# Patient Record
Sex: Female | Born: 1970 | Race: White | Hispanic: No | Marital: Single | Smoking: Current every day smoker
Health system: Southern US, Community
[De-identification: ages and names within clinical notes are randomized; demographics above are authoritative.]

## PROBLEM LIST (undated history)

## (undated) DIAGNOSIS — R609 Edema, unspecified: Secondary | ICD-10-CM

## (undated) DIAGNOSIS — K219 Gastro-esophageal reflux disease without esophagitis: Secondary | ICD-10-CM

## (undated) DIAGNOSIS — E119 Type 2 diabetes mellitus without complications: Secondary | ICD-10-CM

## (undated) DIAGNOSIS — I10 Essential (primary) hypertension: Secondary | ICD-10-CM

## (undated) DIAGNOSIS — I509 Heart failure, unspecified: Secondary | ICD-10-CM

## (undated) HISTORY — PX: BILIOPANCREATIC DIVISION W DUODENAL SWITCH: SHX1098

## (undated) HISTORY — PX: TONSILLECTOMY: SUR1361

## (undated) HISTORY — DX: Heart failure, unspecified: I50.9

## (undated) HISTORY — DX: Edema, unspecified: R60.9

## (undated) HISTORY — PX: CHOLECYSTECTOMY: SHX55

## (undated) HISTORY — PX: DILATION AND CURETTAGE OF UTERUS: SHX78

---

## 1999-01-27 ENCOUNTER — Ambulatory Visit (HOSPITAL_COMMUNITY): Admission: RE | Admit: 1999-01-27 | Discharge: 1999-01-27 | Payer: Self-pay | Admitting: *Deleted

## 1999-05-01 ENCOUNTER — Ambulatory Visit (HOSPITAL_COMMUNITY): Admission: RE | Admit: 1999-05-01 | Discharge: 1999-05-03 | Payer: Self-pay | Admitting: Otolaryngology

## 2000-03-21 ENCOUNTER — Other Ambulatory Visit: Admission: RE | Admit: 2000-03-21 | Discharge: 2000-03-21 | Payer: Self-pay | Admitting: Obstetrics and Gynecology

## 2000-06-01 ENCOUNTER — Emergency Department (HOSPITAL_COMMUNITY): Admission: EM | Admit: 2000-06-01 | Discharge: 2000-06-01 | Payer: Self-pay | Admitting: Emergency Medicine

## 2000-06-01 ENCOUNTER — Encounter: Payer: Self-pay | Admitting: Emergency Medicine

## 2001-07-14 ENCOUNTER — Encounter: Admission: RE | Admit: 2001-07-14 | Discharge: 2001-07-14 | Payer: Self-pay | Admitting: Internal Medicine

## 2002-02-07 ENCOUNTER — Ambulatory Visit (HOSPITAL_BASED_OUTPATIENT_CLINIC_OR_DEPARTMENT_OTHER): Admission: RE | Admit: 2002-02-07 | Discharge: 2002-02-07 | Payer: Self-pay | Admitting: Critical Care Medicine

## 2003-01-16 ENCOUNTER — Emergency Department (HOSPITAL_COMMUNITY): Admission: EM | Admit: 2003-01-16 | Discharge: 2003-01-16 | Payer: Self-pay | Admitting: Emergency Medicine

## 2003-06-18 ENCOUNTER — Other Ambulatory Visit: Admission: RE | Admit: 2003-06-18 | Discharge: 2003-06-18 | Payer: Self-pay | Admitting: Obstetrics and Gynecology

## 2003-07-12 ENCOUNTER — Ambulatory Visit (HOSPITAL_BASED_OUTPATIENT_CLINIC_OR_DEPARTMENT_OTHER): Admission: RE | Admit: 2003-07-12 | Discharge: 2003-07-12 | Payer: Self-pay | Admitting: Critical Care Medicine

## 2003-08-11 ENCOUNTER — Emergency Department (HOSPITAL_COMMUNITY): Admission: EM | Admit: 2003-08-11 | Discharge: 2003-08-12 | Payer: Self-pay | Admitting: *Deleted

## 2004-04-13 ENCOUNTER — Ambulatory Visit: Payer: Self-pay | Admitting: Family Medicine

## 2006-02-24 ENCOUNTER — Encounter (INDEPENDENT_AMBULATORY_CARE_PROVIDER_SITE_OTHER): Payer: Self-pay | Admitting: Specialist

## 2006-02-24 ENCOUNTER — Ambulatory Visit (HOSPITAL_COMMUNITY): Admission: RE | Admit: 2006-02-24 | Discharge: 2006-02-24 | Payer: Self-pay | Admitting: Obstetrics and Gynecology

## 2007-05-08 ENCOUNTER — Ambulatory Visit (HOSPITAL_COMMUNITY): Admission: RE | Admit: 2007-05-08 | Discharge: 2007-05-08 | Payer: Self-pay | Admitting: Obstetrics and Gynecology

## 2007-05-15 ENCOUNTER — Encounter: Admission: RE | Admit: 2007-05-15 | Discharge: 2007-05-15 | Payer: Self-pay | Admitting: Family Medicine

## 2008-09-23 ENCOUNTER — Encounter: Admission: RE | Admit: 2008-09-23 | Discharge: 2008-09-23 | Payer: Self-pay | Admitting: Family Medicine

## 2008-10-30 ENCOUNTER — Encounter: Admission: RE | Admit: 2008-10-30 | Discharge: 2008-10-30 | Payer: Self-pay | Admitting: Family Medicine

## 2009-01-02 DIAGNOSIS — K219 Gastro-esophageal reflux disease without esophagitis: Secondary | ICD-10-CM | POA: Diagnosis present

## 2009-03-13 ENCOUNTER — Inpatient Hospital Stay (HOSPITAL_COMMUNITY): Admission: AD | Admit: 2009-03-13 | Discharge: 2009-03-13 | Payer: Self-pay | Admitting: Obstetrics and Gynecology

## 2009-03-14 ENCOUNTER — Inpatient Hospital Stay (HOSPITAL_COMMUNITY): Admission: AD | Admit: 2009-03-14 | Discharge: 2009-03-14 | Payer: Self-pay | Admitting: Obstetrics and Gynecology

## 2009-03-17 ENCOUNTER — Inpatient Hospital Stay (HOSPITAL_COMMUNITY): Admission: AD | Admit: 2009-03-17 | Discharge: 2009-03-17 | Payer: Self-pay | Admitting: Obstetrics and Gynecology

## 2009-03-20 ENCOUNTER — Inpatient Hospital Stay (HOSPITAL_COMMUNITY): Admission: AD | Admit: 2009-03-20 | Discharge: 2009-03-20 | Payer: Self-pay | Admitting: Obstetrics and Gynecology

## 2009-03-23 ENCOUNTER — Inpatient Hospital Stay (HOSPITAL_COMMUNITY): Admission: AD | Admit: 2009-03-23 | Discharge: 2009-03-23 | Payer: Self-pay | Admitting: Obstetrics and Gynecology

## 2009-03-27 ENCOUNTER — Inpatient Hospital Stay (HOSPITAL_COMMUNITY): Admission: AD | Admit: 2009-03-27 | Discharge: 2009-03-27 | Payer: Self-pay | Admitting: Obstetrics and Gynecology

## 2009-10-27 DIAGNOSIS — E785 Hyperlipidemia, unspecified: Secondary | ICD-10-CM | POA: Diagnosis present

## 2009-10-27 DIAGNOSIS — F4321 Adjustment disorder with depressed mood: Secondary | ICD-10-CM

## 2009-10-27 HISTORY — DX: Adjustment disorder with depressed mood: F43.21

## 2010-05-25 ENCOUNTER — Encounter: Payer: Self-pay | Admitting: Family Medicine

## 2010-08-05 LAB — POCT PREGNANCY, URINE: Preg Test, Ur: POSITIVE

## 2010-08-05 LAB — HCG, QUANTITATIVE, PREGNANCY
hCG, Beta Chain, Quant, S: 7250 m[IU]/mL — ABNORMAL HIGH (ref <5)
hCG, Beta Chain, Quant, S: 7845 m[IU]/mL — ABNORMAL HIGH (ref <5)
hCG, Beta Chain, Quant, S: 6605 m[IU]/mL — ABNORMAL HIGH (ref <5)
hCG, Beta Chain, Quant, S: 8665 m[IU]/mL — ABNORMAL HIGH (ref <5)

## 2010-08-05 LAB — DIFFERENTIAL
Basophils Absolute: 0.1 10*3/uL (ref 0.0–0.1)
Basophils Relative: 1 % (ref 0–1)
Eosinophils Absolute: 0.1 10*3/uL (ref 0.0–0.7)
Eosinophils Absolute: 0.1 10*3/uL (ref 0.0–0.7)
Eosinophils Relative: 2 % (ref 0–5)
Eosinophils Relative: 2 % (ref 0–5)
Lymphocytes Relative: 35 % (ref 12–46)
Lymphs Abs: 2 10*3/uL (ref 0.7–4.0)
Monocytes Relative: 9 % (ref 3–12)
Neutrophils Relative %: 53 % (ref 43–77)
Neutrophils Relative %: 51 % (ref 43–77)

## 2010-08-05 LAB — CREATININE, SERUM
Creatinine, Ser: 0.58 mg/dL (ref 0.4–1.2)
GFR calc Af Amer: >60 mL/min (ref >60)
GFR calc non Af Amer: >60 mL/min (ref >60)
GFR calc non Af Amer: >60 mL/min (ref >60)

## 2010-08-05 LAB — CBC
HCT: 36 % (ref 36.0–46.0)
HCT: 36.8 % (ref 36.0–46.0)
Hemoglobin: 12.2 g/dL (ref 12.0–15.0)
MCV: 91.8 fL (ref 78.0–100.0)
MCV: 92.5 fL (ref 78.0–100.0)
Platelets: 174 10*3/uL (ref 150–400)
Platelets: 196 10*3/uL (ref 150–400)
Platelets: 203 10*3/uL (ref 150–400)
RBC: 4.02 MIL/uL (ref 3.87–5.11)
RDW: 13.7 % (ref 11.5–15.5)
RDW: 13.5 % (ref 11.5–15.5)
WBC: 6.3 10*3/uL (ref 4.0–10.5)
WBC: 6.5 10*3/uL (ref 4.0–10.5)

## 2010-08-05 LAB — AST: AST: 48 U/L — ABNORMAL HIGH (ref 0–37)

## 2010-08-05 LAB — BUN: BUN: 8 mg/dL (ref 6–23)

## 2010-08-31 DIAGNOSIS — I10 Essential (primary) hypertension: Secondary | ICD-10-CM | POA: Diagnosis present

## 2010-09-02 ENCOUNTER — Other Ambulatory Visit: Payer: Self-pay | Admitting: Family Medicine

## 2010-09-02 DIAGNOSIS — Z1231 Encounter for screening mammogram for malignant neoplasm of breast: Secondary | ICD-10-CM

## 2010-09-18 NOTE — Op Note (Signed)
[name], [name]                 ACCOUNT NO.:  [id number]   MEDICAL RECORD NO.:  [id number]          PATIENT TYPE:  AMB   LOCATION:  SDC                           FACILITY:  [facility]   PHYSICIAN:  Malva Limes, M.D.    DATE OF BIRTH:  1970-05-25   DATE OF PROCEDURE:  02/24/2006  DATE OF DISCHARGE:                                 OPERATIVE REPORT   PREOPERATIVE DIAGNOSES:  1. Abnormal uterine bleeding.  2. Possible endometrial polyp.   POSTOPERATIVE DIAGNOSES:  1. Abnormal uterine bleeding.  2. Possible endometrial polyp.   PROCEDURE:  Dilation and curettage.   SURGEON:  Malva Limes, M.D.   ANESTHESIA:  MAC.   DRAINS:  None.   ANTIBIOTICS:  Ancef 1 gram.   SPECIMENS:  Endometrial curettings sent to pathology.   COMPLICATIONS:  None.   PROCEDURE:  The patient was taken to the operating room where a MAC  anesthesia was administered without complications.  She was then placed in  dorsal lithotomy position.  She was prepped with Betadine and draped in the  usual fashion for this procedure.  A sterile speculum was placed in the  vagina.  A single-tooth tenaculum was applied to the anterior cervical lip.  The uterus was sounded to 9 cm.  The cervix was then serially dilated to a  31-French, sharp curettage was then performed and moderate amount of tissue  was removed.  This was sent to pathology.  This concluded the procedure.  The patient was taken to recovery room in stable condition.  Instrument and  lap count correct times one.           ______________________________  Malva Limes, M.D.     MA/MEDQ  D:  02/24/2006  T:  02/25/2006  Job:  [id number]

## 2010-09-18 NOTE — Op Note (Signed)
[name]  Patient:    Lynn Andrade [facility]                       MRN: [id number] Proc. Date: 05/01/99 Adm. Date:  [id number] Attending:  Fernande Boyden CC:         Gloris Manchester. Lazarus Salines, M.D.             Colon Flattery, D.O.             [facility] Sleep lab             Patients chart                           Operative Report  PREOPERATIVE DIAGNOSES: 1. Obstructive sleep apnea. 2. Nasal septal deviation with obstruction. 3. Hypertrophic inferior turbinates, bilateral. 4. Hypertrophic tonsils.  POSTOPERATIVE DIAGNOSES: 1. Obstructive sleep apnea. 2. Nasal septal deviation with obstruction. 3. Hypertrophic inferior turbinates, bilateral. 4. Hypertrophic tonsils.  OPERATIONS: 1. Nasal septoplasty. 2. Modified UPPP. 3. Tonsillectomy/adenoidectomy. 4. Bilateral SMR inferior turbinates.  SURGEON:  Gloris Manchester. Lazarus Salines, M.D.  ANESTHESIA:  General orotracheal.  ESTIMATED BLOOD LOSS:  Minimal.  COMPLICATIONS:  None.  FINDINGS:  Overall narrow nose with a moderate leftward septal deviation and large inferior turbinates bilaterally.  There are 3+ tonsils.  Moderate residual adenoid pad up at the choana.  A long, thin soft palate and central uvula.  DESCRIPTION OF PROCEDURE:  With the patient in the comfortable supine position,  general orotracheal anesthesia was induced without difficulty.  At an appropriate level, the patient was placed in a slight beach chair position and the table turned 90 degrees.  A saline moistened throat pack was placed.  Nasal Fabrizzi were trimmed.  Cocaine crystals 200 mg total were applied on cotton curets to the anterior ethmoid and sphenopalatine ganglion regions on both sides.  Cocaine solution 160 mg total was applied on 1/2 by 3 inch cottonoids to both sides of he septal mucosa.  Then 1% Xylocaine with 1:100,000 epinephrine, 12 cc total, was infiltrated into the floor of the nose on both sides,  into the nasal spine region, into the anterior  membranous columella and into the submucoperichondrial plate of the nasal septum on both sides.  Approximately 10 minutes were allowed for hemostasis and anesthesia to take effect.  Each sterile preparation and draping of the midface was accomplished.  The materials were removed from the nose and throat and were to be intact an correct in number.  The findings were as described above.  A left-sided hemitransfixion incision was sharply executed and carried down to he caudal edge of the quadrangular cartilage and continued to drape the floor of the incision.  The floor tunnel was elevated carried on to the vomer posteriorly and brought forth to the maxillary crest.  A submucoperichondrial tunnel was created on the left side of the septum, carried back to the perpendicular plate of the ethmoid and carried down to the floor of the nose and communicated with the floor tunnel. This flap was elevated intact.  The chondral ethmoid junction was identified and lysed with the caudal elevator. The opposite submucoperiosteal plane was lysed.  The superior perpendicular plate was lysed with an open Jansen-Middleton forceps.  The bone here was very thick.  The inferior portion was dissected free, rocked loose, and delivered with a closed Jansen-Middleton forceps.  Additional bony strictures along the vomer of the chondral  ethmoid region were carefully removed.  At this point, the quadrangular cartilage was disarticulated from the maxillary  crest.  The inferior edge of the quadrangular cartilage which was spurred was resected approximately 2 mm in vertical height and submucosally resected.  The posterior maxillary crest was removed with an open Jansen-Middleton forceps. Slight winging of the maxillary crest was removed with the caudal elevator on each side.  At this point, the septum was straight and was able to be brought in to  the midline with good support of the external nose and secured to the nasal spine with a figure-of-eight 4-0 PDS suture.  Hemostasis was observed.  The septal tunnels were carefully suctioned free.  The flaps were laid down.  The wounds were closed with interrupted 4-0 chromic sutures.  Just prior to completing the septoplasty, the inferior turbinates were infiltrated with a total of 5 cc of 1% Xylocaine with 1:100,000 epinephrine.  After completing the septoplasty, beginning on the right side, the anterior hood of the inferior  turbinate was lysed just inside the nasal valve region.  The medial mucosa and he turbinate was incised in an anterior upsloping fashion.  The turbinate was infractured.  Using angled turbinates scissors, turbinate bone and lateral mucosa was removed in a posterior downsloping fashion taking virtually all the anterior pole and leaving virtually all the posterior pole.  Bony spicules were removed o allow more prompt healing.  The posterior pole was suctioned and coagulated for  hemostasis.  The flap was laid back down and the turbinate was outfractured and  this side was completed.  The left side was done in an identical fashion.  Double-thickness Neosporin impregnated tonsil packs were prepared.  Reinforced 0.030 Silastic splints were prepared and placed in the nasal septal region and secured there with a 3-0 nylon stitch for support up the septum.  Double thickness Telfa plaques were placed along the inferior turbinate on each side.  A 6.5 mm nasal stump which had been shortened to reach just into the nasopharynx were placed on each side between the Silastic and the Telfa to allow postoperative airway. his completed the nasal operation.  Hemostasis was observed.  At this point, the patient was placed in Trendelenburg.  The pharynx was suctioned free.  The throat pack was removed.  Taking care to protect the lips, teeth, and the endotracheal  tube, the Crowe-Davis mouth gag was introduced, expanded for  visualization, and suspended from the Mayo stand in the standard fashion. Findings were as described above.  Palate retractor and mirror were used to visualize the nasopharynx with modest adenoid pad as described above.  Then 0.5% Xylocaine with 1:200,000 epinephrine, 8 cc total, was infiltrated into the peritonsillar plane and on each side for intraoperative hemostasis.  The palate retractor was used to elevate the soft palate so the nasopharynx could be indirectly visualized.  Suction cautery was used to make some small lateral and tags and the moderate adenoid plump at the choana.  This was done with success nd minimal bleeding.  No further attention to the adenoids was felt to be required.  Beginning on the left side, the tonsil was grasped and retracted medially.  The  mucosa overlying the anterior and inferior poles were coagulated and then cut down to the capsule of the tonsil.  Using the cautery tip as a blunt dissector, coagulating crossing vessel was identified, lysing fibrous bands when necessary, the tonsil was dissected free of its muscular fossa from superiorly downward. he tonsil was  removed in its entirety as determined by examination of both tonsil nd fossa.  This small additional quadrant of cautery rendered the fossa hemostatic.  After completing the left side, the right side was done in an identical fashion.  After completing both tonsillectomy and observing for hemostasis, the palatoplasty was planned.  The tattoo mark placed in the office was identified.  A modified palatoplasty with preservation of the posterior aspect of the uvula was planned. Therefore, a roughly diamond-shaped incision was made on the anterior surface of the soft palate with a inverted Chevron for the uvula and resecting a portion of the posterior tonsillar pillar superiorly and a portion of the anterior  soft palatal mucosa in one piece.  Hemostasis was spontaneous.  The posterior tonsillar pillar was incised in a 45 degrees angle fashion allowing part to approximate into the tonsillar fossa and part to approximate up on to the soft palate.  The posterior remnant of the uvula was brought forth into the [place] and secured there too with a 4-0 Vicryl stitch.  Additional Vicryl stitches were used to bring the posterior mucosa of the soft palate forward to allow the closure line to be on the anterior surface of the soft palate and not of the free edge.  The remainder of the posterior tonsillar pillar was secured to the anterior tonsillar pillar under mild tension.  Small horizontal banding of the posterior pharyngeal wall demonstrated adequate tension on the suture line.  Hemostasis was observed. Mirror examination revealed hemostasis in the nasopharynx and also good configuration f the soft palate.  At this point, the mouth gag was relaxed for several minutes.  Upon re-expansion, hemostasis was persistent.  At this point, the procedure was completed.  The palate, retractor, and mouth gag were relaxed and removed.  The dental status was intact.  The patient was returned to anesthesia, awakened, extubated, and transferred to recovery room in stable condition.  COMMENTS:  A 40 year old white female with a long history of snoring and progressive history of sleep apnea documented with nocturnal pause sonography was the indication for the several portions of todays procedure.  Anticipated routine postoperative recovery with attention to analgesia, antibiotics, hydration, and  observation for bleeding, emesis, or airway compromise/hypoventilation/hypoxia.DD:  05/01/99 TD:  05/02/99 Job: [id number]

## 2010-10-12 ENCOUNTER — Ambulatory Visit: Payer: Self-pay

## 2010-10-19 ENCOUNTER — Ambulatory Visit: Payer: Self-pay

## 2012-10-04 ENCOUNTER — Other Ambulatory Visit: Payer: Self-pay | Admitting: Obstetrics and Gynecology

## 2013-08-07 ENCOUNTER — Emergency Department (HOSPITAL_COMMUNITY)
Admission: EM | Admit: 2013-08-07 | Discharge: 2013-08-07 | Disposition: A | Discharge status: Home / Self Care | Payer: BC Managed Care – PPO | Attending: Emergency Medicine | Admitting: Emergency Medicine

## 2013-08-07 ENCOUNTER — Encounter (HOSPITAL_COMMUNITY): Payer: Self-pay | Admitting: Emergency Medicine

## 2013-08-07 DIAGNOSIS — M436 Torticollis: Secondary | ICD-10-CM | POA: Insufficient documentation

## 2013-08-07 DIAGNOSIS — I10 Essential (primary) hypertension: Secondary | ICD-10-CM | POA: Insufficient documentation

## 2013-08-07 DIAGNOSIS — E119 Type 2 diabetes mellitus without complications: Secondary | ICD-10-CM | POA: Insufficient documentation

## 2013-08-07 DIAGNOSIS — R6883 Chills (without fever): Secondary | ICD-10-CM | POA: Insufficient documentation

## 2013-08-07 DIAGNOSIS — F17200 Nicotine dependence, unspecified, uncomplicated: Secondary | ICD-10-CM | POA: Insufficient documentation

## 2013-08-07 DIAGNOSIS — Z8719 Personal history of other diseases of the digestive system: Secondary | ICD-10-CM | POA: Insufficient documentation

## 2013-08-07 HISTORY — DX: Type 2 diabetes mellitus without complications: E11.9

## 2013-08-07 HISTORY — DX: Gastro-esophageal reflux disease without esophagitis: K21.9

## 2013-08-07 HISTORY — DX: Edema, unspecified: R60.9

## 2013-08-07 HISTORY — DX: Essential (primary) hypertension: I10

## 2013-08-07 MED ORDER — CYCLOBENZAPRINE HCL 10 MG PO TABS: 10.0000 mg | ORAL_TABLET | Freq: Two times a day (BID) | ORAL | Status: DC | PRN

## 2013-08-07 MED ORDER — CYCLOBENZAPRINE HCL 10 MG PO TABS
10.0000 mg | ORAL_TABLET | Freq: Once | ORAL | Status: AC
  Administered 2013-08-07: 10 mg via ORAL
  Filled 2013-08-07: qty 1

## 2013-08-07 MED ORDER — HYDROCODONE-ACETAMINOPHEN 5-325 MG PO TABS: 1.0000 | ORAL_TABLET | ORAL | Status: DC | PRN

## 2013-08-07 MED ORDER — OXYCODONE-ACETAMINOPHEN 5-325 MG PO TABS
1.0000 | ORAL_TABLET | Freq: Once | ORAL | Status: AC
  Administered 2013-08-07: 1 via ORAL
  Filled 2013-08-07: qty 1

## 2013-08-07 NOTE — Discharge Instructions (Signed)
Apply heat the the area, do not take the the narcotic or the muscle relaxant if you are driving because they will make you sleepy.

## 2013-08-07 NOTE — ED Notes (Signed)
Neck pain on left side starting yesterday, now radiating down left shoulder and down left side of back.  Reports took a hydrocodone approx 0700 this morning with minimal relief.  Denies injury.

## 2013-08-07 NOTE — ED Notes (Signed)
Hope NP in prior to RN, see NP assessment for further,

## 2013-08-07 NOTE — ED Provider Notes (Signed)
CSN: [id number]     Arrival date & time 08/07/13  0840 History   First MD Initiated Contact with Patient 08/07/13 [phone]     Chief Complaint  Patient presents with  . Neck Pain     (Consider location/radiation/quality/duration/timing/severity/associated sxs/prior Treatment) Patient is a 43 y.o. female presenting with neck pain. The history is provided by the patient.  Neck Pain Pain location:  L side Quality:  Shooting and stabbing Pain radiates to:  L shoulder and L scapula Pain severity:  Severe Pain is:  Same all the time Onset quality:  Gradual Duration:  1 day Timing:  Constant Progression:  Worsening Chronicity:  New Context: not recent injury   Relieved by:  Nothing Worsened by:  Position Ineffective treatments: vicdoin. Associated symptoms: no bladder incontinence, no bowel incontinence, no chest pain, no fever, no headaches, no leg pain, no numbness, no syncope, no tingling, no visual change and no weakness   Risk factors: no hx of head and neck radiation, no hx of osteoporosis, no hx of spinal trauma, no recent epidural, no recent head injury and no recurrent falls    Lynn Andrade is a 43 y.o. female who presents to the ED with left side neck pain that started yesterday and has gotten progressively worse. She took one of her husband's hydrocodone but the pain continues. She denies injury to the area. She denies any other problems.   Past Medical History  Diagnosis Date  . Acid reflux   . Diabetes mellitus without complication   . Fluid retention   . Hypertension    Past Surgical History  Procedure Laterality Date  . Cholecystectomy    . Tonsillectomy    . Dilation and curettage of uterus     No family history on file. History  Substance Use Topics  . Smoking status: Current Every Day Smoker    Types: Cigarettes  . Smokeless tobacco: Not on file  . Alcohol Use: No   OB History   Grav Para Term Preterm Abortions TAB SAB Ect Mult Living                  Review of Systems  Constitutional: Positive for chills. Negative for fever.  Eyes: Negative for visual disturbance.  Cardiovascular: Negative for chest pain and syncope.  Gastrointestinal: Negative for bowel incontinence.  Genitourinary: Negative for bladder incontinence.  Musculoskeletal: Positive for neck pain.  Neurological: Negative for tingling, weakness, numbness and headaches.      Allergies  Etodolac  Home Medications  No current outpatient prescriptions on file. BP 134/81  Pulse 80  Temp(Src) 97.8 F (36.6 C) (Oral)  Resp 20  SpO2 100%  LMP 03/09/2013 Physical Exam  Nursing note and vitals reviewed. Constitutional: She is oriented to person, place, and time. She appears well-developed and well-nourished. No distress.  HENT:  Head: Normocephalic.  Eyes: Conjunctivae and EOM are normal.  Neck: Trachea normal. Neck supple. Muscular tenderness present. No spinous process tenderness present. Decreased range of motion present.    Cardiovascular: Normal rate.   Pulmonary/Chest: Effort normal.  Musculoskeletal:       Cervical back: She exhibits tenderness and spasm (left side). She exhibits no bony tenderness.       Back:  Radial pulses equal, adequate circulation, good touch sensation.   Neurological: She is alert and oriented to person, place, and time. No cranial nerve deficit.  Skin: Skin is warm and dry.  Psychiatric: She has a normal mood and affect. Her behavior is  normal.    ED Course  Procedures  Flexeril and Percocet given and  during ED visit. Patient states the pain has improved.  MDM  43 y.o. female with muscle spasm and pain to the left side of her neck. Will treat with muscle relaxants and pain medication. She will return for worsening symptoms. Stable for discharge without neurovascular deficits, fever or meningeal signs. Discussed with the patient clinical findings and plan of care and all questioned fully answered. She will return if any  problems arise.    Medication List    TAKE these medications       cyclobenzaprine 10 MG tablet  Commonly known as:  FLEXERIL  Take 1 tablet (10 mg total) by mouth 2 (two) times daily as needed for muscle spasms.     HYDROcodone-acetaminophen 5-325 MG per tablet  Commonly known as:  NORCO/VICODIN  Take 1 tablet by mouth every 4 (four) hours as needed.      ASK your doctor about these medications       amLODipine 10 MG tablet  Commonly known as:  NORVASC  Take 10 mg by mouth at bedtime.     b complex vitamins tablet  Take 1 tablet by mouth daily.     cholecalciferol 1000 UNITS tablet  Commonly known as:  VITAMIN D  Take 1,000 Units by mouth daily.     glipiZIDE 10 MG 24 hr tablet  Commonly known as:  GLUCOTROL XL  Take 20 mg by mouth daily.     hydrochlorothiazide 25 MG tablet  Commonly known as:  HYDRODIURIL  Take 25 mg by mouth daily.     lisinopril 40 MG tablet  Commonly known as:  PRINIVIL,ZESTRIL  Take 40 mg by mouth 2 (two) times daily.     metFORMIN 1000 MG tablet  Commonly known as:  GLUCOPHAGE  Take 1,000 mg by mouth 2 (two) times daily with a meal.     OSTEO BI-FLEX ADV DOUBLE ST PO  Take 1 tablet by mouth daily.     pantoprazole 40 MG tablet  Commonly known as:  PROTONIX  Take 40 mg by mouth at bedtime.     pravastatin 20 MG tablet  Commonly known as:  PRAVACHOL  Take 20 mg by mouth at bedtime.           [facility] Orlene Och, [place] 08/08/13 [phone]

## 2013-08-11 NOTE — ED Provider Notes (Signed)
Medical screening examination/treatment/procedure(s) were performed by non-physician practitioner and as supervising physician I was immediately available for consultation/collaboration.   EKG Interpretation None      Devoria Albe, MD, Armando Gang   Ward Givens, MD 08/11/13 [phone]

## 2014-01-18 ENCOUNTER — Other Ambulatory Visit: Payer: Self-pay | Admitting: Obstetrics and Gynecology

## 2014-01-22 LAB — CYTOLOGY - PAP

## 2014-02-19 DIAGNOSIS — G4733 Obstructive sleep apnea (adult) (pediatric): Secondary | ICD-10-CM | POA: Diagnosis present

## 2015-10-22 ENCOUNTER — Ambulatory Visit (INDEPENDENT_AMBULATORY_CARE_PROVIDER_SITE_OTHER): Payer: BLUE CROSS/BLUE SHIELD | Admitting: Physician Assistant

## 2015-10-22 ENCOUNTER — Ambulatory Visit (INDEPENDENT_AMBULATORY_CARE_PROVIDER_SITE_OTHER): Payer: BLUE CROSS/BLUE SHIELD

## 2015-10-22 ENCOUNTER — Encounter: Payer: Self-pay | Admitting: Physician Assistant

## 2015-10-22 VITALS — BP 118/82 | HR 70 | Temp 97.8°F | Ht 64.0 in | Wt 395.4 lb

## 2015-10-22 DIAGNOSIS — M79672 Pain in left foot: Secondary | ICD-10-CM

## 2015-10-22 NOTE — Patient Instructions (Signed)

## 2015-10-22 NOTE — Progress Notes (Signed)
Subjective:     Patient ID: Lynn Andrade, female   DOB: December 05, 1970, 45 y.o.   MRN: [id number]  HPI Pt with L foot pain after a wood drawer was dropped on the top of the foot Sx worse with weightbearing    Review of Systems + pain and swelling to foot Intermit  Numbness after standing for periods of time    Objective:   Physical Exam + ecchy and edema to the superior foot FROM of the foot ankle + TTP of superior and lateral prox  Foot FROM of toes Good sensory Good pulses Xray- no acute finding    Assessment:     1. Left foot pain        Plan:     Heat/Ice OTC meds for sx Nl course reviewed F/U prn

## 2016-04-21 DIAGNOSIS — Z9884 Bariatric surgery status: Secondary | ICD-10-CM

## 2020-07-17 ENCOUNTER — Other Ambulatory Visit: Payer: Self-pay

## 2020-07-17 ENCOUNTER — Emergency Department (HOSPITAL_BASED_OUTPATIENT_CLINIC_OR_DEPARTMENT_OTHER)
Admission: EM | Admit: 2020-07-17 | Discharge: 2020-07-17 | Disposition: A | Discharge status: Home / Self Care | Payer: Self-pay | Attending: Emergency Medicine | Admitting: Emergency Medicine

## 2020-07-17 ENCOUNTER — Encounter (HOSPITAL_BASED_OUTPATIENT_CLINIC_OR_DEPARTMENT_OTHER): Payer: Self-pay | Admitting: *Deleted

## 2020-07-17 DIAGNOSIS — I10 Essential (primary) hypertension: Secondary | ICD-10-CM | POA: Insufficient documentation

## 2020-07-17 DIAGNOSIS — E119 Type 2 diabetes mellitus without complications: Secondary | ICD-10-CM | POA: Insufficient documentation

## 2020-07-17 DIAGNOSIS — Z87891 Personal history of nicotine dependence: Secondary | ICD-10-CM | POA: Insufficient documentation

## 2020-07-17 DIAGNOSIS — L02411 Cutaneous abscess of right axilla: Secondary | ICD-10-CM | POA: Insufficient documentation

## 2020-07-17 DIAGNOSIS — R030 Elevated blood-pressure reading, without diagnosis of hypertension: Secondary | ICD-10-CM

## 2020-07-17 DIAGNOSIS — L0291 Cutaneous abscess, unspecified: Secondary | ICD-10-CM

## 2020-07-17 MED ORDER — DOXYCYCLINE HYCLATE 100 MG PO CAPS: 100.0000 mg | ORAL_CAPSULE | Freq: Two times a day (BID) | ORAL | 0 refills | Status: DC

## 2020-07-17 MED ORDER — ACETAMINOPHEN 500 MG PO TABS
1000.0000 mg | ORAL_TABLET | Freq: Once | ORAL | Status: AC
  Administered 2020-07-17: 1000 mg via ORAL
  Filled 2020-07-17: qty 2

## 2020-07-17 MED ORDER — LIDOCAINE-EPINEPHRINE (PF) 2 %-1:200000 IJ SOLN
INTRAMUSCULAR | Status: AC
  Administered 2020-07-17: 20 mL
  Filled 2020-07-17: qty 20

## 2020-07-17 MED ORDER — DOXYCYCLINE HYCLATE 100 MG PO TABS
100.0000 mg | ORAL_TABLET | Freq: Once | ORAL | Status: AC
  Administered 2020-07-17: 100 mg via ORAL
  Filled 2020-07-17: qty 1

## 2020-07-17 MED ORDER — LIDOCAINE-EPINEPHRINE 2 %-1:200000 IJ SOLN
20.0000 mL | Freq: Once | INTRAMUSCULAR | Status: AC
Start: 2020-07-17 — End: 2020-07-17
  Filled 2020-07-17: qty 20

## 2020-07-17 NOTE — ED Provider Notes (Signed)
[facility] EMERGENCY DEPT Provider Note   CSN: [id number] Arrival date & time: 07/17/20  1320     History Chief Complaint  Patient presents with  . Abscess    Lynn Andrade is a 50 y.o. female.  Patient c/o abscess to right axillary area. Symptoms acute onset in past few days, moderate, dull, constant, today began draining pus. Hx same symptoms in same area 1x in past. Denies recent sting, bite or trauma to area. No fever or chills. No other skin lesions or rash.   The history is provided by the patient.  Abscess Associated symptoms: no fever, no nausea and no vomiting        Past Medical History:  Diagnosis Date  . Acid reflux   . Diabetes mellitus without complication (HCC)   . Fluid retention   . Hypertension     There are no problems to display for this patient.   Past Surgical History:  Procedure Laterality Date  . CHOLECYSTECTOMY    . DILATION AND CURETTAGE OF UTERUS    . TONSILLECTOMY       OB History   No obstetric history on file.     History reviewed. No pertinent family history.  Social History   Tobacco Use  . Smoking status: Former Smoker    Types: Cigarettes    Quit date: 10/02/2015    Years since quitting: 4.7  Substance Use Topics  . Alcohol use: No  . Drug use: No    Home Medications Prior to Admission medications   Not on File    Allergies    Etodolac  Review of Systems   Review of Systems  Constitutional: Negative for chills and fever.  Gastrointestinal: Negative for nausea and vomiting.  Musculoskeletal: Negative for myalgias.  Skin:       Skin abscess.     Physical Exam Updated Vital Signs BP (!) 161/90 (BP Location: Right Arm)   Pulse 70   Temp 98.4 F (36.9 C) (Oral)   Resp 18   Ht 1.676 m (5\' 6" )   Wt (!) 140.6 kg   SpO2 100%   BMI 50.04 kg/m   Physical Exam Vitals and nursing note reviewed.  Constitutional:      Appearance: Normal appearance. She is well-developed.  HENT:     Head:  Atraumatic.     Nose: Nose normal.     Mouth/Throat:     Mouth: Mucous membranes are moist.  Eyes:     General: No scleral icterus.    Conjunctiva/sclera: Conjunctivae normal.  Neck:     Trachea: No tracheal deviation.  Cardiovascular:     Rate and Rhythm: Normal rate.     Pulses: Normal pulses.  Pulmonary:     Effort: Pulmonary effort is normal. No respiratory distress.  Abdominal:     General: There is no distension.  Genitourinary:    Comments: No cva tenderness.  Musculoskeletal:        General: No swelling.     Cervical back: Neck supple. No muscular tenderness.  Skin:    General: Skin is warm and dry.     Findings: No rash.     Comments: Abscess several cm nferior to right axilla, w surrounding erythema. No breast/axillary mass in area noted. Purulent drainage from area but fluctuance/abscess persists. No crepitus. No necrotic tissue.   Neurological:     Mental Status: She is alert.     Comments: Alert, speech normal.   Psychiatric:  Mood and Affect: Mood normal.     ED Results / Procedures / Treatments   Labs (all labs ordered are listed, but only abnormal results are displayed) Labs Reviewed - No data to display  EKG None  Radiology No results found.  Procedures .Marland KitchenIncision and Drainage  Date/Time: 07/17/2020 2:26 PM Performed by: Cathren Laine, MD Authorized by: Cathren Laine, MD   Consent:    Consent given by:  Patient Location:    Type:  Abscess   Location:  Trunk   Trunk location:  Chest Pre-procedure details:    Skin preparation:  Povidone-iodine Anesthesia:    Anesthesia method:  Local infiltration   Local anesthetic:  Lidocaine 2% WITH epi Procedure type:    Complexity:  Complex Procedure details:    Incision types:  Single straight   Incision depth:  Subcutaneous   Wound management:  Probed and deloculated   Drainage:  Purulent   Drainage amount:  Moderate   Packing materials:  1/2 in iodoform gauze Post-procedure details:     Procedure completion:  Tolerated well, no immediate complications     Medications Ordered in ED Medications  lidocaine-EPINEPHrine (XYLOCAINE W/EPI) 2 %-1:200000 (PF) injection 20 mL (has no administration in time range)    ED Course  I have reviewed the triage vital signs and the nursing notes.  Pertinent labs & imaging results that were available during my care of the patient were reviewed by me and considered in my medical decision making (see chart for details).    MDM Rules/Calculators/A&P                          I and D abscess.  Reviewed nursing notes and prior charts for additional history.   Acetaminophen po. Doxycycline po.   Sterile dressing.   w surrounding erythema, will give rx doxycycline.   Return precautions provided.     Final Clinical Impression(s) / ED Diagnoses Final diagnoses:  None    Rx / DC Orders ED Discharge Orders    None       Cathren Laine, MD 07/17/20 1428

## 2020-07-17 NOTE — Discharge Instructions (Addendum)
It was our pleasure to provide your ER care today - we hope that you feel better.  Take doxycycline (antibiotic) as prescribed. Take acetaminophen as need.   Keep area very clean.  Follow up with primary care doctor, or urgent care in 2 days for packing removal/wound check.    Also follow up with your doctor shoulder you notice any persistent mass or swelling in area, or in breast/axilla. Your blood pressure is high today - follow up with primary care doctor in the next couple weeks.   Return to ER if worse, new symptoms, fevers, spreading redness, severe pain, or other concern.

## 2020-07-17 NOTE — ED Triage Notes (Signed)
Recurrent abscess under right arm with drainage.

## 2020-11-30 ENCOUNTER — Other Ambulatory Visit: Payer: Self-pay

## 2020-11-30 ENCOUNTER — Emergency Department (HOSPITAL_BASED_OUTPATIENT_CLINIC_OR_DEPARTMENT_OTHER)
Admission: EM | Admit: 2020-11-30 | Discharge: 2020-11-30 | Disposition: A | Discharge status: Home / Self Care | Payer: Self-pay | Attending: Emergency Medicine | Admitting: Emergency Medicine

## 2020-11-30 ENCOUNTER — Emergency Department (HOSPITAL_BASED_OUTPATIENT_CLINIC_OR_DEPARTMENT_OTHER): Payer: Self-pay

## 2020-11-30 DIAGNOSIS — M25561 Pain in right knee: Secondary | ICD-10-CM | POA: Insufficient documentation

## 2020-11-30 DIAGNOSIS — I10 Essential (primary) hypertension: Secondary | ICD-10-CM | POA: Insufficient documentation

## 2020-11-30 DIAGNOSIS — E119 Type 2 diabetes mellitus without complications: Secondary | ICD-10-CM | POA: Insufficient documentation

## 2020-11-30 DIAGNOSIS — Z87891 Personal history of nicotine dependence: Secondary | ICD-10-CM | POA: Insufficient documentation

## 2020-11-30 DIAGNOSIS — M79604 Pain in right leg: Secondary | ICD-10-CM | POA: Insufficient documentation

## 2020-11-30 MED ORDER — OXYCODONE-ACETAMINOPHEN 5-325 MG PO TABS
1.0000 | ORAL_TABLET | Freq: Once | ORAL | Status: AC
  Administered 2020-11-30: 1 via ORAL
  Filled 2020-11-30: qty 1

## 2020-11-30 NOTE — ED Provider Notes (Signed)
[facility] EMERGENCY DEPT Provider Note   CSN: [id number] Arrival date & time: 11/30/20  1624     History Chief Complaint  Patient presents with   Leg Pain    Lynn Andrade is a 50 y.o. female.  Presents to ER with concern for right knee pain and right leg pain.  Patient states for the last few weeks she has been having bad right knee pain.  Pain worse with increased use, worse after she spends a long time on her feet.  Works as Interior and spatial designer, long days of standing.  Has noted some increased pain and swelling in her right lower leg more recently.  Has not taken any pain medicine for her symptoms.  Had a televisit with her primary doctor and they recommended she go to ER for evaluation of possible blood clot.  Denies prior history of DVT/PE.  No other complaints besides leg.  HPI     Past Medical History:  Diagnosis Date   Acid reflux    Diabetes mellitus without complication (HCC)    Fluid retention    Hypertension     There are no problems to display for this patient.   Past Surgical History:  Procedure Laterality Date   CHOLECYSTECTOMY     DILATION AND CURETTAGE OF UTERUS     TONSILLECTOMY       OB History   No obstetric history on file.     No family history on file.  Social History   Tobacco Use   Smoking status: Former    Types: Cigarettes    Quit date: 10/02/2015    Years since quitting: 5.1  Substance Use Topics   Alcohol use: No   Drug use: No    Home Medications Prior to Admission medications   Medication Sig Start Date End Date Taking? Authorizing Provider  doxycycline (VIBRAMYCIN) 100 MG capsule Take 1 capsule (100 mg total) by mouth 2 (two) times daily. 07/17/20   Cathren Laine, MD    Allergies    Etodolac  Review of Systems   Review of Systems  Constitutional:  Negative for chills and fever.  HENT:  Negative for ear pain and sore throat.   Eyes:  Negative for pain and visual disturbance.  Respiratory:  Negative for cough  and shortness of breath.   Cardiovascular:  Negative for chest pain and palpitations.  Gastrointestinal:  Negative for abdominal pain and vomiting.  Genitourinary:  Negative for dysuria and hematuria.  Musculoskeletal:  Positive for arthralgias. Negative for back pain.  Skin:  Negative for color change and rash.  Neurological:  Negative for seizures and syncope.  All other systems reviewed and are negative.  Physical Exam Updated Vital Signs BP 120/63   Pulse 66   Temp 98.2 F (36.8 C) (Oral)   Resp 18   LMP  (LMP Unknown)   SpO2 97%   Physical Exam Vitals and nursing note reviewed.  Constitutional:      General: She is not in acute distress.    Appearance: She is well-developed.  HENT:     Head: Normocephalic and atraumatic.  Eyes:     Conjunctiva/sclera: Conjunctivae normal.  Cardiovascular:     Rate and Rhythm: Normal rate and regular rhythm.     Heart sounds: No murmur heard. Pulmonary:     Effort: Pulmonary effort is normal. No respiratory distress.  Musculoskeletal:     Cervical back: Neck supple.     Comments: Right lower extremity: Some tenderness to palpation over the  right knee and proximal lower leg, normal DP/PT pulse, normal sensation, normal motor  Skin:    General: Skin is warm and dry.  Neurological:     General: No focal deficit present.     Mental Status: She is alert.    ED Results / Procedures / Treatments   Labs (all labs ordered are listed, but only abnormal results are displayed) Labs Reviewed - No data to display  EKG None  Radiology US Venous Img Lower Unilateral Right  Result Date: 11/30/2020 CLINICAL DATA:  Right calf pain and swelling for 3-4 weeks. EXAM: RIGHT LOWER EXTREMITY VENOUS DOPPLER ULTRASOUND TECHNIQUE: Gray-scale sonography with compression, as well as color and duplex ultrasound, were performed to evaluate the deep venous system(s) from the level of the common femoral vein through the popliteal and proximal calf veins.  COMPARISON:  None. FINDINGS: VENOUS Normal compressibility of the common femoral, superficial femoral, and popliteal veins, as well as the visualized calf veins. Visualized portions of profunda femoral vein and great saphenous vein unremarkable. No filling defects to suggest DVT on grayscale or color Doppler imaging. Doppler waveforms show normal direction of venous flow, normal respiratory plasticity and response to augmentation. Limited views of the contralateral common femoral vein are unremarkable. OTHER There is a fluid collection in the right popliteal region measuring 2.3 x 1.8 x 1.5 cm, adjacent to the vessels. No internal blood flow. Limitations: The posterior tibial vein is only seen at the ankle. The peroneal vein was not visualized. IMPRESSION: 1. The posterior tibial vein was only seen at the ankle. The peroneal vein was not visualized. No DVT identified. 2. The 2.3 x 1.8 x 1.5 cm fluid collection in the popliteal region adjacent the vessels likely represents a Baker's cyst. There is no internal blood flow. Electronically Signed   By: Gerome Sam III M.D   On: 11/30/2020 18:02   DG Knee Complete 4 Views Right  Result Date: 11/30/2020 CLINICAL DATA:  Right knee pain EXAM: RIGHT KNEE - COMPLETE 4+ VIEW COMPARISON:  None. FINDINGS: Normal alignment. No fracture or dislocation. Moderate tricompartmental degenerate arthritis. No effusion. Mild diffuse subcutaneous edema noted. IMPRESSION: No acute fracture or dislocation. Electronically Signed   By: Helyn Numbers MD   On: 11/30/2020 21:40    Procedures Procedures   Medications Ordered in ED Medications  oxyCODONE-acetaminophen (PERCOCET/ROXICET) 5-325 MG per tablet 1 tablet (1 tablet Oral Given 11/30/20 2013)    ED Course  I have reviewed the triage vital signs and the nursing notes.  Pertinent labs & imaging results that were available during my care of the patient were reviewed by me and considered in my medical decision making (see  chart for details).    MDM Rules/Calculators/A&P                           50 year old presents here with concern for right knee pain, right lower leg pain.  DVT study negative, x-ray negative.  X-ray did demonstrate moderate degenerative arthritis.  Suspect this is most likely her source of pain.  Neurovascularly intact.  Recommend patient follow-up with Ortho and primary care.  Reviewed return precautions and discharged.    After the discussed management above, the patient was determined to be safe for discharge.  The patient was in agreement with this plan and all questions regarding their care were answered.  ED return precautions were discussed and the patient will return to the ED with any significant worsening of  condition.  Final Clinical Impression(s) / ED Diagnoses Final diagnoses:  Right knee pain, unspecified chronicity    Rx / DC Orders ED Discharge Orders     None        Milagros Loll, MD 11/30/20 2332

## 2020-11-30 NOTE — ED Notes (Signed)
Pt verbalizes understanding of discharge instructions. Opportunity for questioning and answers were provided. Armand removed by staff, pt discharged from ED to home. Educated to f/u with Ortho 

## 2020-11-30 NOTE — ED Triage Notes (Signed)
Patient was reportedly sent from tele-health visit to r/o blood clot in right calf and shin area

## 2020-11-30 NOTE — ED Notes (Signed)
Pt given snacks to prevent nausea from pain meds

## 2020-11-30 NOTE — Discharge Instructions (Addendum)
Schedule follow-up appointment with orthopedic surgery.  Recommend Tylenol and Motrin as needed for pain control.  Bear weight as tolerated.  Come back to ER if you develop significant increase in swelling, pain, redness, or other new concerns symptom.

## 2021-07-14 DIAGNOSIS — Z03818 Encounter for observation for suspected exposure to other biological agents ruled out: Secondary | ICD-10-CM | POA: Diagnosis not present

## 2021-07-14 DIAGNOSIS — R051 Acute cough: Secondary | ICD-10-CM | POA: Diagnosis not present

## 2021-07-14 DIAGNOSIS — R509 Fever, unspecified: Secondary | ICD-10-CM | POA: Diagnosis not present

## 2021-07-14 DIAGNOSIS — Z20822 Contact with and (suspected) exposure to covid-19: Secondary | ICD-10-CM | POA: Diagnosis not present

## 2021-07-14 DIAGNOSIS — J069 Acute upper respiratory infection, unspecified: Secondary | ICD-10-CM | POA: Diagnosis not present

## 2021-07-15 ENCOUNTER — Emergency Department (INDEPENDENT_AMBULATORY_CARE_PROVIDER_SITE_OTHER): Payer: 59

## 2021-07-15 ENCOUNTER — Other Ambulatory Visit: Payer: Self-pay

## 2021-07-15 ENCOUNTER — Emergency Department
Admission: RE | Admit: 2021-07-15 | Discharge: 2021-07-15 | Disposition: A | Discharge status: Home / Self Care | Payer: 59 | Source: Ambulatory Visit

## 2021-07-15 VITALS — BP 124/77 | HR 82 | Temp 98.3°F | Resp 18 | Ht 66.0 in | Wt 312.0 lb

## 2021-07-15 DIAGNOSIS — R059 Cough, unspecified: Secondary | ICD-10-CM | POA: Diagnosis not present

## 2021-07-15 DIAGNOSIS — R0981 Nasal congestion: Secondary | ICD-10-CM

## 2021-07-15 DIAGNOSIS — R062 Wheezing: Secondary | ICD-10-CM | POA: Diagnosis not present

## 2021-07-15 DIAGNOSIS — R067 Sneezing: Secondary | ICD-10-CM | POA: Diagnosis not present

## 2021-07-15 DIAGNOSIS — J309 Allergic rhinitis, unspecified: Secondary | ICD-10-CM

## 2021-07-15 DIAGNOSIS — J029 Acute pharyngitis, unspecified: Secondary | ICD-10-CM | POA: Diagnosis not present

## 2021-07-15 DIAGNOSIS — J0100 Acute maxillary sinusitis, unspecified: Secondary | ICD-10-CM | POA: Diagnosis not present

## 2021-07-15 MED ORDER — PREDNISONE 20 MG PO TABS: ORAL_TABLET | ORAL | 0 refills | Status: DC

## 2021-07-15 MED ORDER — AMOXICILLIN-POT CLAVULANATE 875-125 MG PO TABS: 1.0000 | ORAL_TABLET | Freq: Two times a day (BID) | ORAL | 0 refills | Status: AC

## 2021-07-15 MED ORDER — CEFTRIAXONE SODIUM 500 MG IJ SOLR
500.0000 mg | Freq: Once | INTRAMUSCULAR | Status: AC
  Administered 2021-07-15: 500 mg via INTRAMUSCULAR

## 2021-07-15 MED ORDER — PROMETHAZINE-DM 6.25-15 MG/5ML PO SYRP: 5.0000 mL | ORAL_SOLUTION | Freq: Two times a day (BID) | ORAL | 0 refills | Status: DC | PRN

## 2021-07-15 MED ORDER — METHYLPREDNISOLONE SODIUM SUCC 125 MG IJ SOLR
125.0000 mg | Freq: Once | INTRAMUSCULAR | Status: AC
  Administered 2021-07-15: 125 mg via INTRAMUSCULAR

## 2021-07-15 MED ORDER — FEXOFENADINE HCL 180 MG PO TABS: 180.0000 mg | ORAL_TABLET | Freq: Every day | ORAL | 0 refills | Status: DC

## 2021-07-15 MED ORDER — BENZONATATE 200 MG PO CAPS: 200.0000 mg | ORAL_CAPSULE | Freq: Three times a day (TID) | ORAL | 0 refills | Status: AC | PRN

## 2021-07-15 NOTE — ED Provider Notes (Signed)
?[facility] ? ? ? ?CSN: [id number] ?Arrival date & time: 07/15/21  0901 ? ? ?  ? ?History   ?Chief Complaint ?Chief Complaint  ?Patient presents with  ? Cough  ?  Coughing, sneezing, nasal congestion, sore throat and body aches. X2 days  ? ? ?HPI ?Lynn Andrade is a 51 y.o. female.  ? ?HPI 51 year old female presents with coughing, sneezing, nasal congestion, sore throat and body aches for 2 days.  Patient reports that she was evaluated at [facility] yesterday, 07/13/2021 was tested for COVID-19 and Influenza A/B which were both negative.  Patient reports that she was prescribed Tessalon Perles for cough.  DMH significant for morbid obesity, hypertension, and T2DM without complication. ? ?Past Medical History:  ?Diagnosis Date  ? Acid reflux   ? Diabetes mellitus without complication (HCC)   ? Fluid retention   ? Hypertension   ? ? ?There are no problems to display for this patient. ? ? ?Past Surgical History:  ?Procedure Laterality Date  ? CHOLECYSTECTOMY    ? DILATION AND CURETTAGE OF UTERUS    ? TONSILLECTOMY    ? ? ?OB History   ?No obstetric history on file. ?  ? ? ? ?Home Medications   ? ?Prior to Admission medications   ?Medication Sig Start Date End Date Taking? Authorizing Provider  ?amoxicillin-clavulanate (AUGMENTIN) 875-125 MG tablet Take 1 tablet by mouth 2 (two) times daily for 10 days. 07/15/21 07/25/21 Yes Trevor Iha, FNP  ?benzonatate (TESSALON) 200 MG capsule Take 1 capsule (200 mg total) by mouth 3 (three) times daily as needed for up to 7 days for cough. 07/15/21 07/22/21 Yes Trevor Iha, FNP  ?Biotin 10 MG CAPS Take by mouth.   Yes [provider]  ?cyanocobalamin 1000 MCG tablet Take by mouth.   Yes [provider]  ?FEROSUL 325 (65 Fe) MG tablet Take 325 mg by mouth 2 (two) times daily. 02/28/21  Yes [provider]  ?fexofenadine (ALLEGRA ALLERGY) 180 MG tablet Take 1 tablet (180 mg total) by mouth daily for 15 days. 07/15/21 07/30/21 Yes Trevor Iha, FNP  ?fluticasone (FLONASE) 50 MCG/ACT nasal spray Place into both nostrils daily.   Yes [provider]  ?omeprazole (PRILOSEC) 10 MG capsule Take 10 mg by mouth daily.   Yes [provider]  ?predniSONE (DELTASONE) 20 MG tablet Take 3 tabs PO daily x 5 days. 07/15/21  Yes Trevor Iha, FNP  ?promethazine-dextromethorphan (PROMETHAZINE-DM) 6.25-15 MG/5ML syrup Take 5 mLs by mouth 2 (two) times daily as needed for cough. 07/15/21  Yes Trevor Iha, FNP  ? ? ?Family History ?History reviewed. No pertinent family history. ? ?Social History ?Social History  ? ?Tobacco Use  ? Smoking status: Former  ?  Types: Cigarettes  ?  Quit date: 10/02/2015  ?  Years since quitting: 5.7  ?Substance Use Topics  ? Alcohol use: No  ? Drug use: No  ? ? ? ?Allergies   ?Aspirin-caffeine and Etodolac ? ? ?Review of Systems ?Review of Systems  ?Constitutional: Negative.   ?HENT:  Positive for congestion and sore throat.   ?Respiratory:  Positive for cough.   ?Musculoskeletal:  Positive for myalgias.  ?All other systems reviewed and are negative. ? ? ?Physical Exam ?Triage Vital Signs ?ED Triage Vitals  ?Enc Vitals Group  ?   BP 07/15/21 0929 124/77  ?   Pulse Rate 07/15/21 0929 82  ?   Resp 07/15/21 0929 18  ?   Temp 07/15/21 6578  98.3 ?F (36.8 ?C)  ?   Temp Source 07/15/21 0929 Oral  ?   SpO2 07/15/21 0929 97 %  ?   Weight 07/15/21 0923 (!) 312 lb (141.5 kg)  ?   Height 07/15/21 0923 5\' 6"  (1.676 m)  ?   Head Circumference --   ?   Peak Flow --   ?   Pain Score 07/15/21 0923 5  ?   Pain Loc --   ?   Pain Edu? --   ?   Excl. in GC? --   ? ?No data found. ? ?Updated Vital Signs ?BP 124/77 (BP Location: Left Arm)   Pulse 82   Temp 98.3 ?F (36.8 ?C) (Oral)   Resp 18   Ht 5\' 6"  (1.676 m)   Wt (!) 312 lb (141.5 kg)   SpO2 97%   BMI 50.36 kg/m?  ? ?  ? ?Physical Exam ?Vitals and nursing note reviewed.  ?Constitutional:   ?   General: She is not in acute distress. ?   Appearance: Normal appearance. She is obese.  She is ill-appearing.  ?HENT:  ?   Head: Normocephalic and atraumatic.  ?   Right Ear: Tympanic membrane and external ear normal.  ?   Left Ear: Tympanic membrane and external ear normal.  ?   Ears:  ?   Comments: Moderate to significant eustachian tube dysfunction noted bilaterally ?   Nose:  ?   Comments: Turbinates are erythematous/edematous ?   Mouth/Throat:  ?   Mouth: Mucous membranes are moist.  ?   Pharynx: Oropharynx is clear.  ?   Comments: Moderate to significant amount of clear drainage of posterior oropharynx noted ?Eyes:  ?   Extraocular Movements: Extraocular movements intact.  ?   Conjunctiva/sclera: Conjunctivae normal.  ?   Pupils: Pupils are equal, round, and reactive to light.  ?Cardiovascular:  ?   Rate and Rhythm: Normal rate and regular rhythm.  ?   Pulses: Normal pulses.  ?   Heart sounds: Normal heart sounds.  ?Pulmonary:  ?   Effort: Pulmonary effort is normal.  ?   Breath sounds: Wheezing present. No rhonchi or rales.  ?   Comments: Diffuse wheezing noted throughout with frequent nonproductive cough on exam ?Musculoskeletal:  ?   Cervical back: Normal range of motion and neck supple. No tenderness.  ?Lymphadenopathy:  ?   Cervical: Cervical adenopathy present.  ?Skin: ?   General: Skin is warm.  ?Neurological:  ?   General: No focal deficit present.  ?   Mental Status: She is alert and oriented to person, place, and time.  ? ? ? ?UC Treatments / Results  ?Labs ?(all labs ordered are listed, but only abnormal results are displayed) ?Labs Reviewed - No data to display ? ?EKG ? ? ?Radiology ?DG Chest 2 View ? ?Result Date: 07/15/2021 ?CLINICAL DATA:  Cough, sneezing, nasal congestion, sore throat and body aches. EXAM: CHEST - 2 VIEW COMPARISON:  None. FINDINGS: Trachea is midline. Heart size normal. Lungs are clear. No pleural fluid. Degenerative changes in the spine. IMPRESSION: No acute findings. Electronically Signed   By: Leanna Battles M.D.   On: 07/15/2021 10:44    ? ?Procedures ?Procedures (including critical care time) ? ?Medications Ordered in UC ?Medications  ?methylPREDNISolone sodium succinate (SOLU-MEDROL) 125 mg/2 mL injection 125 mg (125 mg Intramuscular Given 07/15/21 1117)  ?cefTRIAXone (ROCEPHIN) injection 500 mg (500 mg Intramuscular Given 07/15/21 1117)  ? ? ?Initial Impression / Assessment and  Plan / UC Course  ?I have reviewed the triage vital signs and the nursing notes. ? ?Pertinent labs & imaging results that were available during my care of the patient were reviewed by me and considered in my medical decision making (see chart for details). ? ?  ? ?MDM: 1.  Cough-CXR revealed no acute cardiopulmonary process, IM Rocephin 500 mg given once in clinic, Rx'd Tessalon Perles and Promethazine DM; 2.  Wheezes-IM Solu-Medrol 125 mg given once in clinic, Rx'd prednisone burst; 3.  Subacute maxillary sinusitis-Rx'd Augmentin; 4.  Allergic rhinitis-Rx'd Allegra. Informed patient of chest x-ray results today with hard copy provided no acute cardiopulmonary process noted.  Advised patient to take medication as directed with food to completion.  Advised patient to take prednisone and Allegra with first dose of Augmentin for the next 5 of 10 days.  Advised may use Allegra as needed afterwards for concurrent postnasal drainage/drip.  Advised patient may use Tessalon Perles daily or as needed for cough.  Advised may use promethazine DM for cough and early evening or prior to sleep due to sedative effects.  Advised patient not to use Tessalon Perles and Promethazine DM together.  Encouraged patient to increase daily water intake while taking these medications.  Advised patient if symptoms worsen and/or unresolved please follow-up with PCP or here for further evaluation.  Patient discharged home, hemodynamically stable. ?Final Clinical Impressions(s) / UC Diagnoses  ? ?Final diagnoses:  ?Cough, unspecified type  ?Subacute maxillary sinusitis  ?Wheezes  ?Allergic rhinitis,  unspecified seasonality, unspecified trigger  ? ? ? ?Discharge Instructions   ? ?  ?Informed patient of chest x-ray results today with hard copy provided no acute cardiopulmonary process noted.  Advised patient to take medication as di

## 2021-07-15 NOTE — ED Triage Notes (Signed)
Pt states that she has a cough, sneezing, nasal congestion, sore throat and body aches.  X2 days ?Pt states that she was seen at another urgent care on 3/14 and was tested for covid and flu which was negative. ? ?Pt states that she hasn't had flu vaccine. ?Pt states that she is vaccinated for covid. ?

## 2021-07-15 NOTE — Discharge Instructions (Addendum)
Informed patient of chest x-ray results today with hard copy provided no acute cardiopulmonary process noted.  Advised patient to take medication as directed with food to completion.  Advised patient to take prednisone and Allegra with first dose of Augmentin for the next 5 of 10 days.  Advised may use Allegra as needed afterwards for concurrent postnasal drainage/drip.  Advised patient may use Tessalon Perles daily or as needed for cough.  Advised may use promethazine DM for cough and early evening or prior to sleep due to sedative effects.  Advised patient not to use Tessalon Perles and Promethazine DM together.  Encouraged patient to increase daily water intake while taking these medications.  Advised patient if symptoms worsen and/or unresolved please follow-up with PCP or here for further evaluation. ?

## 2021-07-17 ENCOUNTER — Other Ambulatory Visit: Payer: Self-pay

## 2021-07-17 ENCOUNTER — Emergency Department (HOSPITAL_BASED_OUTPATIENT_CLINIC_OR_DEPARTMENT_OTHER)
Admission: EM | Admit: 2021-07-17 | Discharge: 2021-07-17 | Disposition: A | Discharge status: Home / Self Care | Payer: 59 | Attending: Emergency Medicine | Admitting: Emergency Medicine

## 2021-07-17 ENCOUNTER — Encounter (HOSPITAL_BASED_OUTPATIENT_CLINIC_OR_DEPARTMENT_OTHER): Payer: Self-pay | Admitting: *Deleted

## 2021-07-17 DIAGNOSIS — Z20822 Contact with and (suspected) exposure to covid-19: Secondary | ICD-10-CM | POA: Diagnosis not present

## 2021-07-17 DIAGNOSIS — I10 Essential (primary) hypertension: Secondary | ICD-10-CM | POA: Diagnosis not present

## 2021-07-17 DIAGNOSIS — B349 Viral infection, unspecified: Secondary | ICD-10-CM | POA: Diagnosis not present

## 2021-07-17 DIAGNOSIS — R059 Cough, unspecified: Secondary | ICD-10-CM | POA: Diagnosis not present

## 2021-07-17 DIAGNOSIS — E119 Type 2 diabetes mellitus without complications: Secondary | ICD-10-CM | POA: Insufficient documentation

## 2021-07-17 LAB — RESP PANEL BY RT-PCR (FLU A&B, COVID) ARPGX2
Influenza A by PCR: NEGATIVE
Influenza B by PCR: NEGATIVE
SARS Coronavirus 2 by RT PCR: NEGATIVE

## 2021-07-17 NOTE — ED Triage Notes (Addendum)
Pt is here bc she still does not feel good.  URI with cough and cold and congestion. Pt feels very sore in her ribs from coughing and feels that she has a bloated abdomen.  ?

## 2021-07-17 NOTE — ED Provider Notes (Signed)
?[facility] ?Provider Note ? ? ?CSN: [id number] ?Arrival date & time: 07/17/21  1345 ? ?  ? ?History ? ?Chief Complaint  ?Patient presents with  ? URI  ? ? ?Lynn Andrade is a 51 y.o. female. ? ?Patient here with a complaint of not feeling any better despite being seen at [facility] urgent care 2 days ago.  Patient with persistent cough congestion sore throat some feeling of abdominal bloating.  1 episode of vomiting today.  No diarrhea.  A lot of soreness in the chest with coughing.  Patient when seen at [facility] did have a chest x-ray done which was negative.  She was noted to be wheezing.  Patient received their shot of Rocephin and a shot of steroids.  Discharged with Augmentin prednisone course Allegra Tessalon Perles and a cough medicine.  Patient denies any significant abdominal pain.  Patient's vital signs today are reassuring temp is 98.6 oxygen saturations are 94%.  Also patient has albuterol at home and she is doing 2 puffs every 6 hours.  Past medical history significant for acid reflux diabetes without complications fluid retention and hypertension.  Of note patient does not have any diabetic medicine listed on her medication list. ? ? ?  ? ?Home Medications ?Prior to Admission medications   ?Medication Sig Start Date End Date Taking? Authorizing Provider  ?amoxicillin-clavulanate (AUGMENTIN) 875-125 MG tablet Take 1 tablet by mouth 2 (two) times daily for 10 days. 07/15/21 07/25/21  Trevor Iha, FNP  ?benzonatate (TESSALON) 200 MG capsule Take 1 capsule (200 mg total) by mouth 3 (three) times daily as needed for up to 7 days for cough. 07/15/21 07/22/21  Trevor Iha, FNP  ?Biotin 10 MG CAPS Take by mouth.    [provider]  ?cyanocobalamin 1000 MCG tablet Take by mouth.    [provider]  ?FEROSUL 325 (65 Fe) MG tablet Take 325 mg by mouth 2 (two) times daily. 02/28/21   [provider]  ?fexofenadine (ALLEGRA ALLERGY) 180 MG tablet Take  1 tablet (180 mg total) by mouth daily for 15 days. 07/15/21 07/30/21  Trevor Iha, FNP  ?fluticasone (FLONASE) 50 MCG/ACT nasal spray Place into both nostrils daily.    [provider]  ?omeprazole (PRILOSEC) 10 MG capsule Take 10 mg by mouth daily.    [provider]  ?predniSONE (DELTASONE) 20 MG tablet Take 3 tabs PO daily x 5 days. 07/15/21   Trevor Iha, FNP  ?promethazine-dextromethorphan (PROMETHAZINE-DM) 6.25-15 MG/5ML syrup Take 5 mLs by mouth 2 (two) times daily as needed for cough. 07/15/21   Trevor Iha, FNP  ?   ? ?Allergies    ?Aspirin-caffeine and Etodolac   ? ?Review of Systems   ?Review of Systems  ?HENT:  Positive for congestion and sore throat.   ?Respiratory:  Positive for cough.   ?Cardiovascular:  Positive for chest pain.  ?Gastrointestinal:  Positive for vomiting. Negative for diarrhea.  ?Musculoskeletal:  Positive for myalgias.  ? ?Physical Exam ?Updated Vital Signs ?BP (!) 146/71 (BP Location: Right Arm)   Pulse 76   Temp 98.6 ?F (37 ?C) (Oral)   Resp 16   Ht 1.676 m (5\' 6" )   Wt (!) 151.5 kg   SpO2 96%   BMI 53.91 kg/m?  ?Physical Exam ?Vitals and nursing note reviewed.  ?Constitutional:   ?   General: She is not in acute distress. ?   Appearance: Normal appearance. She is well-developed.  ?HENT:  ?   Head: Normocephalic and atraumatic.  ?  Eyes:  ?   Extraocular Movements: Extraocular movements intact.  ?   Conjunctiva/sclera: Conjunctivae normal.  ?   Pupils: Pupils are equal, round, and reactive to light.  ?Cardiovascular:  ?   Rate and Rhythm: Normal rate and regular rhythm.  ?   Heart sounds: No murmur heard. ?Pulmonary:  ?   Effort: Pulmonary effort is normal. No respiratory distress.  ?   Breath sounds: No stridor. Wheezing present. No rhonchi or rales.  ?Abdominal:  ?   General: There is no distension.  ?   Palpations: Abdomen is soft.  ?   Tenderness: There is no abdominal tenderness.  ?Musculoskeletal:     ?   General: No swelling.  ?   Cervical back:  Normal range of motion and neck supple.  ?Skin: ?   General: Skin is warm and dry.  ?   Capillary Refill: Capillary refill takes less than 2 seconds.  ?Neurological:  ?   General: No focal deficit present.  ?   Mental Status: She is alert and oriented to person, place, and time.  ?   Cranial Nerves: No cranial nerve deficit.  ?   Sensory: No sensory deficit.  ?Psychiatric:     ?   Mood and Affect: Mood normal.  ? ? ?ED Results / Procedures / Treatments   ?Labs ?(all labs ordered are listed, but only abnormal results are displayed) ?Labs Reviewed  ?RESP PANEL BY RT-PCR (FLU A&B, COVID) [id number]  ? ? ?EKG ?None ? ?Radiology ?No results found. ? ?Procedures ?Procedures  ? ? ?Medications Ordered in ED ?Medications - No data to display ? ?ED Course/ Medical Decision Making/ A&P ?  ?                        ?Medical Decision Making ?Patient exam here significant for some bilateral faint wheezing.  No respiratory distress.  Oxygen sats are good respiratory rate is 16.  Patient's abdomen without any acute abdominal process on exam. ? ?We will have patient continue her current medications.  Patient does not need a work note.  Make sure she was using her albuterol 2 puffs every 6 hours.  And that she was taking her prednisone.  We will have her finish out her course of medications that were prescribed by the urgent care.  Suspect this is a viral illness. ? ?Patient's COVID influenza was negative here. ? ?Patient will return for any new or worse symptoms.  Based on the medications that she was given from the urgent care pretty much would cover anything of bacterial concern like strep throat certainly the Augmentin would be beneficial for pneumonia there was no evidence of pneumonia on chest x-ray 2 days ago.  Also would help sinus infection.  I suspect she just needs symptomatic treatment for a viral syndrome type illness. ? ? ? ?Final Clinical Impression(s) / ED Diagnoses ?Final diagnoses:  ?Viral syndrome  ? ? ?Rx / DC  Orders ?ED Discharge Orders   ? ? None  ? ?  ? ? ?  ?Vanetta Mulders, MD ?07/17/21 1539 ? ?

## 2021-07-17 NOTE — Discharge Instructions (Signed)
Continue your current medications continue the antibiotic continue the prednisone continue the cough medicine.  Chest x-ray reviewed from the other day definitely clear.  COVID influenza negative here today.  Most likely this is a viral illness.  Therefore the antibiotics will not be particularly helpful.  But since she started them I would finish them.  Also would add on Tylenol and/or Motrin as you need for the chest soreness from coughing.  Return for high fever return for worsening abdominal pain or persistent vomiting.  ?

## 2021-08-18 ENCOUNTER — Ambulatory Visit (HOSPITAL_BASED_OUTPATIENT_CLINIC_OR_DEPARTMENT_OTHER): Payer: 59 | Admitting: Nurse Practitioner

## 2021-08-20 ENCOUNTER — Ambulatory Visit (HOSPITAL_BASED_OUTPATIENT_CLINIC_OR_DEPARTMENT_OTHER): Payer: 59 | Admitting: Nurse Practitioner

## 2021-09-15 ENCOUNTER — Other Ambulatory Visit (HOSPITAL_BASED_OUTPATIENT_CLINIC_OR_DEPARTMENT_OTHER): Payer: Self-pay | Admitting: Nurse Practitioner

## 2021-09-15 DIAGNOSIS — M7989 Other specified soft tissue disorders: Secondary | ICD-10-CM

## 2021-10-12 ENCOUNTER — Ambulatory Visit: Payer: Self-pay | Admitting: Nurse Practitioner

## 2021-10-26 ENCOUNTER — Ambulatory Visit: Payer: Self-pay | Admitting: Nurse Practitioner

## 2021-11-09 ENCOUNTER — Ambulatory Visit (INDEPENDENT_AMBULATORY_CARE_PROVIDER_SITE_OTHER): Payer: 59 | Admitting: Nurse Practitioner

## 2021-11-09 ENCOUNTER — Encounter: Payer: Self-pay | Admitting: Nurse Practitioner

## 2021-11-09 VITALS — BP 148/80 | Ht 66.0 in | Wt 319.0 lb

## 2021-11-09 DIAGNOSIS — N912 Amenorrhea, unspecified: Secondary | ICD-10-CM | POA: Diagnosis not present

## 2021-11-09 DIAGNOSIS — Z01419 Encounter for gynecological examination (general) (routine) without abnormal findings: Secondary | ICD-10-CM

## 2021-11-09 NOTE — Progress Notes (Signed)
   Lynn Andrade 03/09/1971 [id number]   History:  51 y.o. G2P0020 presents as new patient to establish care without GYN complaints. Irregular menses since menarche at age 14. No bleeding x 2 years, denies menopausal symptoms. Would like to know if she is menopausal. Normal pap history. H/O DM, HTN managed by PCP, not currently on medication. Wieght loss surgery in 2017, has gained back 60 pounds since pandemic, plans to work on weight loss.  Gynecologic History No LMP recorded (lmp unknown). (Menstrual status: Irregular Periods).   Contraception/Family planning: none Sexually active: Yes  Health Maintenance Last Pap: 01/18/2014. Results were: Normal Last mammogram: 2015. Results were: Normal Last colonoscopy: Never Last Dexa: Not indicated  Past medical history, past surgical history, family history and social history were all reviewed and documented in the EPIC chart. Hair dresser. 2 adopted sons Franky Macho age 47 and Ave Filter age 64.   ROS:  A ROS was performed and pertinent positives and negatives are included.  Exam:  Vitals:   11/09/21 1421  BP: (!) 148/80  Weight: (!) 319 lb (144.7 kg)  Height: 5\' 6"  (1.676 m)   Body mass index is 51.49 kg/m.  General appearance:  Normal Thyroid:  Symmetrical, normal in size, without palpable masses or nodularity. Respiratory  Auscultation:  Clear without wheezing or rhonchi Cardiovascular  Auscultation:  Regular rate, without rubs, murmurs or gallops  Edema/varicosities:  Not grossly evident Abdominal  Soft,nontender, without masses, guarding or rebound.  Liver/spleen:  No organomegaly noted  Hernia:  None appreciated  Skin  Inspection:  Grossly normal Breasts: Examined lying and sitting.   Right: Without masses, retractions, nipple discharge or axillary adenopathy.   Left: Without masses, retractions, nipple discharge or axillary adenopathy. Genitourinary   Inguinal/mons:  Normal without inguinal adenopathy  External genitalia:   Normal appearing vulva with no masses, tenderness, or lesions  BUS/Urethra/Skene's glands:  Normal  Vagina:  Normal appearing with normal color and discharge, no lesions  Cervix:  Normal appearing without discharge or lesions  Uterus:  Difficult to palpate due to body habitus but not gross masses or tenderness  Adnexa/parametria:     Rt: Normal in size, without masses or tenderness.   Lt: Normal in size, without masses or tenderness.  Anus and perineum: Normal  Digital rectal exam: Normal sphincter tone without palpated masses or tenderness  Patient informed chaperone available to be present for breast and pelvic exam. Patient has requested no chaperone to be present. Patient has been advised what will be completed during breast and pelvic exam.   Assessment/Plan:  51 y.o. G2P0020 to establish care.   Well female exam with routine gynecological exam - Plan: Cytology - PAP( [place]). Education provided on SBEs, importance of preventative screenings, current guidelines, high calcium diet, regular exercise, and multivitamin daily.  Labs with PCP.   Amenorrhea - Plan: Estradiol, Follicle stimulating hormone. H/O irregular menses since menarche at age 74. No bleeding x 2 years, denies menopausal symptoms. Would like to know if she is menopausal. Will check labs today.  Screening for cervical cancer - Normal Pap history. Pap today.   Screening for breast cancer - Normal mammogram history. Overdue and encouraged to schedule now. Information provided on [facility].  Normal breast exam today.  Screening for colon cancer - Referral made by PCP, in process.  Return in 1 year for annual.     8 DNP, 3:03 PM 11/09/2021

## 2021-11-10 ENCOUNTER — Ambulatory Visit
Admission: EM | Admit: 2021-11-10 | Discharge: 2021-11-10 | Disposition: A | Discharge status: Home / Self Care | Payer: 59 | Attending: Emergency Medicine | Admitting: Emergency Medicine

## 2021-11-10 DIAGNOSIS — R300 Dysuria: Secondary | ICD-10-CM | POA: Insufficient documentation

## 2021-11-10 DIAGNOSIS — R350 Frequency of micturition: Secondary | ICD-10-CM | POA: Insufficient documentation

## 2021-11-10 LAB — POCT URINALYSIS DIP (MANUAL ENTRY)
Glucose, UA: 100 mg/dL — AB
Nitrite, UA: POSITIVE — AB
Protein Ur, POC: 30 mg/dL — AB
Spec Grav, UA: 1.025 (ref 1.010–1.025)
Urobilinogen, UA: 2 E.U./dL — AB
pH, UA: 6.5 (ref 5.0–8.0)

## 2021-11-10 LAB — FOLLICLE STIMULATING HORMONE: FSH: 27.2 m[IU]/mL

## 2021-11-10 LAB — ESTRADIOL: Estradiol: 19 pg/mL

## 2021-11-10 MED ORDER — NITROFURANTOIN MONOHYD MACRO 100 MG PO CAPS: 100.0000 mg | ORAL_CAPSULE | Freq: Two times a day (BID) | ORAL | 0 refills | Status: AC

## 2021-11-10 NOTE — ED Triage Notes (Signed)
Pt c/o frequent urination with lower abd/ bladder discomfort.  Started: today   Home interventions: AZO

## 2021-11-10 NOTE — ED Provider Notes (Signed)
[facility]    CSN: [id number] Arrival date & time: 11/10/21  1651    HISTORY   Chief Complaint  Patient presents with   Abdominal Pain   HPI Lynn Andrade is a pleasant, 51 y.o. female who presents to urgent care today complaining of increased frequency of urination and discomfort in her lower abdomen that feels like pressure.  Patient states she is also noticed that she has an increased urge to urinate but does not feel like she can empty her bladder.  Patient states she has been taking Azo for her symptoms.  Urinalysis performed in the clinic today definitely reflects patient having taken Azo.  The history is provided by the patient.   Past Medical History:  Diagnosis Date   Acid reflux    Diabetes mellitus without complication (HCC)    Fluid retention    Hypertension    There are no problems to display for this patient.  Past Surgical History:  Procedure Laterality Date   CHOLECYSTECTOMY     DILATION AND CURETTAGE OF UTERUS     TONSILLECTOMY     OB History     Gravida  2   Para  0   Term  0   Preterm  0   AB  2   Living  0      SAB  1   IAB  0   Ectopic  1   Multiple  0   Live Births  0          Home Medications    Prior to Admission medications   Medication Sig Start Date End Date Taking? Authorizing Provider  Biotin 10 MG CAPS Take by mouth.    [provider]  cyanocobalamin 1000 MCG tablet Take by mouth.    [provider]  FEROSUL 325 (65 Fe) MG tablet Take 325 mg by mouth 2 (two) times daily. 02/28/21   [provider]  fexofenadine (ALLEGRA ALLERGY) 180 MG tablet Take 1 tablet (180 mg total) by mouth daily for 15 days. 07/15/21 07/30/21  Trevor Iha, FNP  fluticasone (FLONASE) 50 MCG/ACT nasal spray Place into both nostrils daily.    [provider]  omeprazole (PRILOSEC) 10 MG capsule Take 10 mg by mouth daily.    [provider]    Family History Family History   Problem Relation Age of Onset   Diabetes Mother    Hypertension Mother    Bladder Cancer Mother    Breast cancer Mother    Kidney cancer Mother    Diabetes Father    Social History Social History   Tobacco Use   Smoking status: Every Day    Types: Cigarettes    Last attempt to quit: 10/02/2015    Years since quitting: 6.1  Substance Use Topics   Alcohol use: Yes   Drug use: No   Allergies   Aspirin-caffeine and Etodolac  Review of Systems Review of Systems Pertinent findings revealed after performing a 14 point review of systems has been noted in the history of present illness.  Physical Exam Triage Vital Signs ED Triage Vitals  Enc Vitals Group     BP 02/27/21 0827 (!) 147/82     Pulse Rate 02/27/21 0827 72     Resp 02/27/21 0827 18     Temp 02/27/21 0827 98.3 F (36.8 C)     Temp Source 02/27/21 0827 Oral     SpO2 02/27/21 0827 98 %     Weight --  Height --      Head Circumference --      Peak Flow --      Pain Score 02/27/21 0826 5     Pain Loc --      Pain Edu? --      Excl. in GC? --   No data found.  Updated Vital Signs BP 139/74 (BP Location: Left Arm)   Pulse 75   Temp (!) 97.5 F (36.4 C) (Oral)   Resp 18   LMP  (LMP Unknown)   SpO2 97%   Physical Exam Vitals and nursing note reviewed.  Constitutional:      General: She is awake. She is not in acute distress.    Appearance: Normal appearance. She is well-developed and well-groomed. She is morbidly obese. She is not ill-appearing.  HENT:     Head: Normocephalic and atraumatic.  Eyes:     General: Lids are normal.        Right eye: No discharge.        Left eye: No discharge.     Extraocular Movements: Extraocular movements intact.     Conjunctiva/sclera: Conjunctivae normal.     Right eye: Right conjunctiva is not injected.     Left eye: Left conjunctiva is not injected.  Neck:     Trachea: Trachea and phonation normal.  Cardiovascular:     Rate and Rhythm: Normal rate and regular  rhythm.     Pulses: Normal pulses.     Heart sounds: Normal heart sounds. No murmur heard.    No friction rub. No gallop.  Pulmonary:     Effort: Pulmonary effort is normal. No accessory muscle usage, prolonged expiration or respiratory distress.     Breath sounds: Normal breath sounds. No stridor, decreased air movement or transmitted upper airway sounds. No decreased breath sounds, wheezing, rhonchi or rales.  Chest:     Chest wall: No tenderness.  Abdominal:     General: Abdomen is flat. Bowel sounds are normal. There is no distension.     Palpations: Abdomen is soft.     Tenderness: There is abdominal tenderness in the suprapubic area. There is no right CVA tenderness or left CVA tenderness.     Hernia: No hernia is present.  Musculoskeletal:        General: Normal range of motion.     Cervical back: Normal range of motion and neck supple. Normal range of motion.  Lymphadenopathy:     Cervical: No cervical adenopathy.  Skin:    General: Skin is warm and dry.     Findings: No erythema or rash.  Neurological:     General: No focal deficit present.     Mental Status: She is alert and oriented to person, place, and time.  Psychiatric:        Mood and Affect: Mood normal.        Behavior: Behavior normal. Behavior is cooperative.     Visual Acuity Right Eye Distance:   Left Eye Distance:   Bilateral Distance:    Right Eye Near:   Left Eye Near:    Bilateral Near:     [facility] Couse / Diagnostics / Procedures:     Radiology No results found.  Procedures Procedures (including critical care time) EKG  Pending results:  Labs Reviewed  POCT URINALYSIS DIP (MANUAL ENTRY) - Abnormal; Notable for the following components:      Result Value   Color, UA orange (*)    Clarity, UA cloudy (*)  Glucose, UA =100 (*)    Bilirubin, UA small (*)    Ketones, POC UA trace (5) (*)    Blood, UA moderate (*)    Protein Ur, POC =30 (*)    Urobilinogen, UA 2.0 (*)    Nitrite, UA  Positive (*)    Leukocytes, UA Small (1+) (*)    All other components within normal limits  URINE CULTURE    Medications Ordered in UC: Medications - No data to display  UC Diagnoses / Final Clinical Impressions(s)   I have reviewed the triage vital signs and the nursing notes.  Pertinent labs & imaging results that were available during my care of the patient were reviewed by me and considered in my medical decision making (see chart for details).    Final diagnoses:  Increased frequency of urination  Dysuria   Urinalysis not useful as the diagnostic due to Azo.  Urine culture will be performed per protocol.  Patient will be started empirically on Macrobid as this covers E. coli which causes 90% of uncomplicated urinary tract infections in women.  We will adjust antibiotics as needed based on results of urine culture.  ED Prescriptions     Medication Sig Dispense Auth. Provider   nitrofurantoin, macrocrystal-monohydrate, (MACROBID) 100 MG capsule Take 1 capsule (100 mg total) by mouth 2 (two) times daily for 3 days. 6 capsule Theadora Rama Scales, PA-C      PDMP not reviewed this encounter.  Disposition Upon Discharge:  Condition: stable for discharge home  Patient presented with concern for an acute illness with associated systemic symptoms and significant discomfort requiring urgent management. In my opinion, this is a condition that a prudent lay person (someone who possesses an average knowledge of health and medicine) may potentially expect to result in complications if not addressed urgently such as respiratory distress, impairment of bodily function or dysfunction of bodily organs.   As such, the patient has been evaluated and assessed, work-up was performed and treatment was provided in alignment with urgent care protocols and evidence based medicine.  Patient/parent/caregiver has been advised that the patient may require follow up for further testing and/or treatment if  the symptoms continue in spite of treatment, as clinically indicated and appropriate.  Routine symptom specific, illness specific and/or disease specific instructions were discussed with the patient and/or caregiver at length.  Prevention strategies for avoiding STD exposure were also discussed.  The patient will follow up with their current PCP if and as advised. If the patient does not currently have a PCP we will assist them in obtaining one.   The patient may need specialty follow up if the symptoms continue, in spite of conservative treatment and management, for further workup, evaluation, consultation and treatment as clinically indicated and appropriate.  Patient/parent/caregiver verbalized understanding and agreement of plan as discussed.  All questions were addressed during visit.  Please see discharge instructions below for further details of plan.  Discharge Instructions:   Discharge Instructions      The urinalysis that we performed in the clinic today was abnormal.  Urine culture will be performed per our protocol.  The result of the urine culture will be available in the next 3 to 5 days and will be posted to your MyChart account.  If there is an abnormal finding, you will be contacted by phone and advised of further treatment recommendations, if any.   You were advised to begin antibiotics today because you are having active symptoms of a urinary  tract infection.  It is very important that you take all doses exactly as prescribed.  Incomplete antibiotic therapy can cause worsening urinary tract infection that can become aggressive, reach the level of your kidneys causing kidney infection and possible hospitalization.   If you have not had complete resolution of your symptoms after completing treatment as prescribed, please return to urgent care for repeat evaluation or follow-up with your primary care provider.   Thank you for visiting urgent care today.  I appreciate the  opportunity to participate in your care.       This office note has been dictated using Teaching laboratory technician.  Unfortunately, this method of dictation can sometimes lead to typographical or grammatical errors.  I apologize for your inconvenience in advance if this occurs.  Please do not hesitate to reach out to me if clarification is needed.       Theadora Rama Scales, PA-C 11/10/21 1726

## 2021-11-10 NOTE — Discharge Instructions (Signed)
The urinalysis that we performed in the clinic today was abnormal.  Urine culture will be performed per our protocol.  The result of the urine culture will be available in the next 3 to 5 days and will be posted to your MyChart account.  If there is an abnormal finding, you will be contacted by phone and advised of further treatment recommendations, if any. ?  ?You were advised to begin antibiotics today because you are having active symptoms of a urinary tract infection.  It is very important that you take all doses exactly as prescribed.  Incomplete antibiotic therapy can cause worsening urinary tract infection that can become aggressive, reach the level of your kidneys causing kidney infection and possible hospitalization. ?  ?If you have not had complete resolution of your symptoms after completing treatment as prescribed, please return to urgent care for repeat evaluation or follow-up with your primary care provider. ?  ?Thank you for visiting urgent care today.  I appreciate the opportunity to participate in your care.  ?

## 2021-11-12 LAB — URINE CULTURE

## 2021-11-12 LAB — CYTOLOGY - PAP
Adequacy: ABNORMAL
Comment: NEGATIVE
High risk HPV: NEGATIVE

## 2021-11-25 ENCOUNTER — Ambulatory Visit
Admission: EM | Admit: 2021-11-25 | Discharge: 2021-11-25 | Disposition: A | Discharge status: Home / Self Care | Payer: 59 | Attending: Urgent Care | Admitting: Urgent Care

## 2021-11-25 ENCOUNTER — Encounter: Payer: Self-pay | Admitting: Emergency Medicine

## 2021-11-25 DIAGNOSIS — H6983 Other specified disorders of Eustachian tube, bilateral: Secondary | ICD-10-CM | POA: Diagnosis not present

## 2021-11-25 DIAGNOSIS — N762 Acute vulvitis: Secondary | ICD-10-CM

## 2021-11-25 DIAGNOSIS — J069 Acute upper respiratory infection, unspecified: Secondary | ICD-10-CM | POA: Diagnosis not present

## 2021-11-25 MED ORDER — DOXYCYCLINE HYCLATE 100 MG PO CAPS: 100.0000 mg | ORAL_CAPSULE | Freq: Two times a day (BID) | ORAL | 0 refills | Status: DC

## 2021-11-25 MED ORDER — PREDNISONE 50 MG PO TABS: 50.0000 mg | ORAL_TABLET | Freq: Every day | ORAL | 0 refills | Status: DC

## 2021-11-25 NOTE — ED Triage Notes (Signed)
Pt here with nasal congestion left ear fullness, scratchy throat, fever, body aches and congestion x 3 days. Pt also here with an abscess to right labia for the last 3 weeks that has gotten bigger and more painful. It is not currently draining.

## 2021-11-25 NOTE — Discharge Instructions (Signed)
Please make sure you are applying warm compresses 3-5 times a day 5 to 10 minutes at a time to the infection of the labia.  Start taking doxycycline to help with the infection.  If you continue to worsen then please present to the emergency room where they could use imaging to see where they can do procedural drainage of the infection.  Otherwise, we will be using supportive care including prednisone to help you with your sinuses, allergies in the context of having a viral upper respiratory infection.  Make sure you drink plenty of fluids and get some rest is much as possible.

## 2021-11-25 NOTE — ED Provider Notes (Signed)
[facility]   MRN: [id number] DOB: 1971/01/15  Subjective:   Lynn Andrade is a 51 y.o. female presenting for 3-day history of acute onset persistent sinus congestion, left ear fullness, scratchy throat, body aches.  She has a history of allergic rhinitis and takes Nasacort, Xyzal.  She is also had a persistent and worsening infection of the right labia.  Reports that she generally gets infections in this area but most of the time they pop and drain on their own.  This one however has not.  No nausea, vomiting, abdominal pelvic pain.  No current facility-administered medications for this encounter.  Current Outpatient Medications:    Biotin 10 MG CAPS, Take by mouth., Disp: , Rfl:    cyanocobalamin 1000 MCG tablet, Take by mouth., Disp: , Rfl:    FEROSUL 325 (65 Fe) MG tablet, Take 325 mg by mouth 2 (two) times daily., Disp: , Rfl:    fexofenadine (ALLEGRA ALLERGY) 180 MG tablet, Take 1 tablet (180 mg total) by mouth daily for 15 days., Disp: 15 tablet, Rfl: 0   fluticasone (FLONASE) 50 MCG/ACT nasal spray, Place into both nostrils daily., Disp: , Rfl:    omeprazole (PRILOSEC) 10 MG capsule, Take 10 mg by mouth daily., Disp: , Rfl:    Allergies  Allergen Reactions   Aspirin-Caffeine Hives and Swelling   Etodolac     Past Medical History:  Diagnosis Date   Acid reflux    Diabetes mellitus without complication (HCC)    Fluid retention    Hypertension      Past Surgical History:  Procedure Laterality Date   CHOLECYSTECTOMY     DILATION AND CURETTAGE OF UTERUS     TONSILLECTOMY      Family History  Problem Relation Age of Onset   Diabetes Mother    Hypertension Mother    Bladder Cancer Mother    Breast cancer Mother    Kidney cancer Mother    Diabetes Father     Social History   Tobacco Use   Smoking status: Every Day    Types: Cigarettes    Last attempt to quit: 10/02/2015    Years since quitting: 6.1  Substance Use Topics   Alcohol use:  Yes   Drug use: No    ROS   Objective:   Vitals: BP 138/82   Pulse 68   Temp 98.2 F (36.8 C)   Resp 20   LMP  (LMP Unknown)   SpO2 98%   Physical Exam Exam conducted with a chaperone present Hydrologist).  Constitutional:      General: She is not in acute distress.    Appearance: Normal appearance. She is well-developed and normal weight. She is not ill-appearing, toxic-appearing or diaphoretic.  HENT:     Head: Normocephalic and atraumatic.     Right Ear: Ear canal and external ear normal. No drainage or tenderness. No middle ear effusion. There is no impacted cerumen. Tympanic membrane is not erythematous.     Left Ear: Ear canal and external ear normal. No drainage or tenderness.  No middle ear effusion. There is no impacted cerumen. Tympanic membrane is not erythematous.     Ears:     Comments: TMs opacified bilaterally.    Nose: Congestion present. No rhinorrhea.     Mouth/Throat:     Mouth: Mucous membranes are moist. No oral lesions.     Pharynx: No pharyngeal swelling, oropharyngeal exudate, posterior oropharyngeal erythema or uvula swelling.  Tonsils: No tonsillar exudate or tonsillar abscesses.  Eyes:     General: No scleral icterus.       Right eye: No discharge.        Left eye: No discharge.     Extraocular Movements: Extraocular movements intact.     Right eye: Normal extraocular motion.     Left eye: Normal extraocular motion.     Conjunctiva/sclera: Conjunctivae normal.  Cardiovascular:     Rate and Rhythm: Normal rate.  Pulmonary:     Effort: Pulmonary effort is normal.  Genitourinary:   Musculoskeletal:     Cervical back: Normal range of motion and neck supple.  Lymphadenopathy:     Cervical: No cervical adenopathy.  Skin:    General: Skin is warm and dry.  Neurological:     General: No focal deficit present.     Mental Status: She is alert and oriented to person, place, and time.  Psychiatric:        Mood and Affect: Mood normal.         Behavior: Behavior normal.     Assessment and Plan :   PDMP not reviewed this encounter.  1. Viral upper respiratory infection   2. Eustachian tube dysfunction, bilateral   3. Cellulitis of labia majora     We discussed options for her possible labial abscess versus doing aspiration.  Unfortunately the tissue is very indurated and I could not guarantee that I would locate the appropriate area.  Patient refused either procedure for now.  She prefers to just use an antibiotic as she has never had to undergo this kind of procedure for this infection.  I advised that she will need this if she worsens.  In the meantime start doxycycline to help with the infection is warm compresses.  I will be using an oral prednisone course for her sinuses and will hold off on antibiotic use for now.  Patient has no lower respiratory symptoms and therefore deferred chest x-ray. Counseled patient on potential for adverse effects with medications prescribed/recommended today, ER and return-to-clinic precautions discussed, patient verbalized understanding.    Wallis Bamberg, [place] 11/25/21 1208

## 2021-11-26 ENCOUNTER — Other Ambulatory Visit: Payer: Self-pay

## 2021-11-26 ENCOUNTER — Encounter (HOSPITAL_COMMUNITY): Payer: Self-pay

## 2021-11-26 ENCOUNTER — Emergency Department (HOSPITAL_COMMUNITY)
Admission: EM | Admit: 2021-11-26 | Discharge: 2021-11-26 | Disposition: A | Discharge status: Home / Self Care | Payer: 59 | Attending: Emergency Medicine | Admitting: Emergency Medicine

## 2021-11-26 DIAGNOSIS — L0291 Cutaneous abscess, unspecified: Secondary | ICD-10-CM

## 2021-11-26 DIAGNOSIS — N764 Abscess of vulva: Secondary | ICD-10-CM | POA: Diagnosis not present

## 2021-11-26 DIAGNOSIS — L02214 Cutaneous abscess of groin: Secondary | ICD-10-CM | POA: Diagnosis not present

## 2021-11-26 NOTE — ED Provider Triage Note (Signed)
Emergency Medicine Provider Triage Evaluation Note  Lynn Andrade , a 51 y.o. female  was evaluated in triage.  Pt complains of she presents for evaluation of painful swelling of the right labia, despite starting antibiotic, doxycycline yesterday for same.  She had been seen and treated at that time by a provider at an urgent care.  She is also complaining of sinus problems and prescribed prednisone for that.  Review of Systems  Positive: Recurrent abscesses in various parts of the body Negative: No fever, weakness or dizziness  Physical Exam  BP 140/66 (BP Location: Left Arm)   Pulse 75   Temp 98.3 F (36.8 C) (Oral)   Resp 16   Ht 5\' 6"  (1.676 m)   Wt (!) 144.7 kg   LMP  (LMP Unknown)   SpO2 99%   BMI 51.49 kg/m  Gen:   Awake, no distress   Resp:  Normal effort  MSK:   Moves extremities without difficulty  Other:  Morbidly obese  Medical Decision Making  Medically screening exam initiated at 11:51 AM.  Appropriate orders placed.  Lynn Andrade was informed that the remainder of the evaluation will be completed by another provider, this initial triage assessment does not replace that evaluation, and the importance of remaining in the ED until their evaluation is complete.  Unable to assess patient, and in the triage area for extent of abscess.  Based on description yesterday in the EMR, she will need a I&D procedure.   , MD 11/26/21 1159

## 2021-11-26 NOTE — ED Provider Notes (Signed)
[facility]-EMERGENCY DEPT Provider Note   CSN: [id number] Arrival date & time: 11/26/21  1057     History  Chief Complaint  Patient presents with   Abscess    Lynn Andrade is a 51 y.o. female.  HPI Lynn Andrade , a 51 y.o. female  was evaluated in triage.  Pt complains of she presents for evaluation of painful swelling of the right labia, despite starting antibiotic, doxycycline yesterday for same.  She had been seen and treated at that time by a provider at an urgent care.  She is also complaining of sinus problems and prescribed prednisone for that.    Home Medications Prior to Admission medications   Medication Sig Start Date End Date Taking? Authorizing Provider  Biotin 10 MG CAPS Take by mouth.    [provider]  cyanocobalamin 1000 MCG tablet Take by mouth.    [provider]  doxycycline (VIBRAMYCIN) 100 MG capsule Take 1 capsule (100 mg total) by mouth 2 (two) times daily. 11/25/21   Wallis Bamberg, PA-C  FEROSUL 325 (65 Fe) MG tablet Take 325 mg by mouth 2 (two) times daily. 02/28/21   [provider]  fexofenadine (ALLEGRA ALLERGY) 180 MG tablet Take 1 tablet (180 mg total) by mouth daily for 15 days. 07/15/21 07/30/21  Trevor Iha, FNP  fluticasone (FLONASE) 50 MCG/ACT nasal spray Place into both nostrils daily.    [provider]  omeprazole (PRILOSEC) 10 MG capsule Take 10 mg by mouth daily.    [provider]  predniSONE (DELTASONE) 50 MG tablet Take 1 tablet (50 mg total) by mouth daily with breakfast. 11/25/21   Wallis Bamberg, PA-C      Allergies    Aspirin-caffeine, Etodolac, and Toradol [ketorolac tromethamine]    Review of Systems   Review of Systems  Physical Exam Updated Vital Signs BP 136/83 (BP Location: Right Arm)   Pulse 64   Temp 98.1 F (36.7 C) (Oral)   Resp 14   Ht 5\' 6"  (1.676 m)   Wt (!) 144.7 kg   LMP  (LMP Unknown)   SpO2 97%   BMI 51.49 kg/m  Physical Exam Vitals  and nursing note reviewed.  Constitutional:      General: She is not in acute distress.    Appearance: She is well-developed. She is obese. She is not ill-appearing.  HENT:     Head: Normocephalic and atraumatic.     Right Ear: External ear normal.     Left Ear: External ear normal.  Eyes:     Conjunctiva/sclera: Conjunctivae normal.     Pupils: Pupils are equal, round, and reactive to light.  Neck:     Trachea: Phonation normal.  Cardiovascular:     Rate and Rhythm: Normal rate.  Pulmonary:     Effort: Pulmonary effort is normal.  Abdominal:     General: There is no distension.  Genitourinary:    Comments: Examination at 3:35 PM-right lower labia, indurated, slightly swollen, draining purulent material at the lateral aspect of the swollen area.  This area has decompressed and is soft.  Patient reports that she feels substantially better since the abscess began draining while waiting to be seen. Musculoskeletal:        General: Normal range of motion.     Cervical back: Normal range of motion and neck supple.  Skin:    General: Skin is warm and dry.  Neurological:     Mental Status: She is alert and oriented  to person, place, and time.     Cranial Nerves: No cranial nerve deficit.     Sensory: No sensory deficit.     Motor: No abnormal muscle tone.     Coordination: Coordination normal.  Psychiatric:        Mood and Affect: Mood normal.        Behavior: Behavior normal.        Thought Content: Thought content normal.        Judgment: Judgment normal.     ED Results / Procedures / Treatments   Labs (all labs ordered are listed, but only abnormal results are displayed) Labs Reviewed - No data to display  EKG None  Radiology No results found.  Procedures Procedures    Medications Ordered in ED Medications - No data to display  ED Course/ Medical Decision Making/ A&P                           Medical Decision Making Patient presenting with suspected labial  abscess, gradually worse despite treatment with antibiotics initiated yesterday.  She was started on doxycycline.  She denies fever, chills, weakness or dizziness.  After initial evaluation at triage, while waiting in the waiting room, she felt the abscess spontaneously drained.  Patient was examined afterwards and feels better and found to have a draining abscess that can be treated symptomatically with outpatient management.  Problems Addressed: Abscess: acute illness or injury  Amount and/or Complexity of Data Reviewed Independent Historian:     Details: She is a cogent history  Risk OTC drugs. Decision regarding hospitalization. Risk Details: Patient presents with abscess to the labia which spontaneously drained while emergency department.  On examination her pain is improved after spontaneous drainage.  She does not require I&D at this time.  She is currently taking antibiotics.  There is no sign of sepsis.  Doubt STD.  Stable for discharge with wound care.  She does not require hospitalization.           Final Clinical Impression(s) / ED Diagnoses Final diagnoses:  Abscess    Rx / DC Orders ED Discharge Orders     None         Mancel Bale, MD 11/27/21 [phone]

## 2021-11-26 NOTE — ED Triage Notes (Signed)
Patient states she has an abscess to the right labia x 3 weeks. Patient states it has increased greatly.

## 2021-11-26 NOTE — Discharge Instructions (Signed)
The abscess is draining spontaneously.  To help it continue to heal, soak in a warm tub, for 20 minutes 3-4 times a day until the wound is better and there is less pain.  Continue to take the doxycycline to treat for infection.  Return here, if needed for problems.

## 2021-11-27 ENCOUNTER — Encounter: Payer: Self-pay | Admitting: Gastroenterology

## 2021-12-01 ENCOUNTER — Telehealth: Payer: Self-pay | Admitting: *Deleted

## 2021-12-01 NOTE — Telephone Encounter (Signed)
Dr.Beavers,  This patient is scheduled for a direct screening colon with you. Her last BMI=51 on July 10,2023.  Okay for direct hospital or OV? Please advise. Thank you, Junita Kubota PV

## 2021-12-02 NOTE — Telephone Encounter (Signed)
Contacted patient and let her know that she would have to be rescheduled for a procedure at the hospital with Dr.Beavers. Patient stated that she needed a Monday. I explained to patient how hospital days worked and that she would be contacted once we had a date.

## 2021-12-07 NOTE — Telephone Encounter (Signed)
Patient added to hospital wait list. She will be contacted once a date becomes available.

## 2021-12-07 NOTE — Telephone Encounter (Signed)
Please add to Dr. Orvan Falconer hospital list.

## 2021-12-18 ENCOUNTER — Telehealth: Payer: Self-pay

## 2021-12-18 ENCOUNTER — Other Ambulatory Visit: Payer: Self-pay

## 2021-12-18 DIAGNOSIS — Z1211 Encounter for screening for malignant neoplasm of colon: Secondary | ICD-10-CM

## 2021-12-18 NOTE — Telephone Encounter (Signed)
Patient's colon has been rescheduled for 12/22/21 at 7:30 am at [facility] with Dr. Orvan Falconer. Pt states she is not diabetic or on any blood thinners. She is aware a caregiver will need to be with her for the procedure. Instructions have been given over the phone & patient will also pick up prep instructions Monday morning. She has been able to verbalize all understanding. She has been advised to call back with any questions.

## 2021-12-18 NOTE — Telephone Encounter (Signed)
Amb ref & hospital orders placed. Amy Hazelwood notified of amb ref since procedure is coming up soon.

## 2021-12-18 NOTE — Telephone Encounter (Signed)
Left message for patient to call back in regards to an opening date for colonoscopy to be rescheduled.

## 2021-12-21 ENCOUNTER — Telehealth: Payer: Self-pay | Admitting: Gastroenterology

## 2021-12-21 NOTE — Telephone Encounter (Signed)
Hospital schedulers notified to cancel procedure.

## 2021-12-21 NOTE — Telephone Encounter (Signed)
Left message for patient to call back  

## 2021-12-21 NOTE — Telephone Encounter (Signed)
Inbound call from patient stating she has to reschedule upcoming procedure at [facility] 12/22/21 at 7:30. Patient states she is unable to come and pick up paperwork and does not have transportation. Please give a call back to further advise.  Thank you

## 2021-12-22 ENCOUNTER — Encounter (HOSPITAL_COMMUNITY): Admission: RE | Payer: Self-pay | Source: Home / Self Care

## 2021-12-22 ENCOUNTER — Ambulatory Visit (HOSPITAL_COMMUNITY): Admission: RE | Admit: 2021-12-22 | Payer: 59 | Source: Home / Self Care | Admitting: Gastroenterology

## 2021-12-22 SURGERY — COLONOSCOPY WITH PROPOFOL
Anesthesia: Monitor Anesthesia Care

## 2022-01-11 ENCOUNTER — Encounter: Payer: 59 | Admitting: Gastroenterology

## 2022-01-12 ENCOUNTER — Other Ambulatory Visit (HOSPITAL_BASED_OUTPATIENT_CLINIC_OR_DEPARTMENT_OTHER): Payer: Self-pay | Admitting: Physician Assistant

## 2022-01-12 ENCOUNTER — Other Ambulatory Visit (HOSPITAL_BASED_OUTPATIENT_CLINIC_OR_DEPARTMENT_OTHER): Payer: 59

## 2022-01-12 ENCOUNTER — Ambulatory Visit (HOSPITAL_BASED_OUTPATIENT_CLINIC_OR_DEPARTMENT_OTHER)
Admission: RE | Admit: 2022-01-12 | Discharge: 2022-01-12 | Disposition: A | Discharge status: Home / Self Care | Payer: 59 | Source: Ambulatory Visit | Attending: Physician Assistant | Admitting: Physician Assistant

## 2022-01-12 DIAGNOSIS — M79661 Pain in right lower leg: Secondary | ICD-10-CM | POA: Insufficient documentation

## 2022-01-12 DIAGNOSIS — M7989 Other specified soft tissue disorders: Secondary | ICD-10-CM | POA: Diagnosis not present

## 2022-01-13 ENCOUNTER — Ambulatory Visit: Payer: 59 | Attending: Internal Medicine | Admitting: Internal Medicine

## 2022-01-13 ENCOUNTER — Encounter: Payer: Self-pay | Admitting: Internal Medicine

## 2022-01-13 VITALS — BP 144/81 | HR 75 | Ht 65.5 in | Wt 338.8 lb

## 2022-01-13 DIAGNOSIS — R011 Cardiac murmur, unspecified: Secondary | ICD-10-CM

## 2022-01-13 DIAGNOSIS — I10 Essential (primary) hypertension: Secondary | ICD-10-CM | POA: Diagnosis not present

## 2022-01-13 DIAGNOSIS — I509 Heart failure, unspecified: Secondary | ICD-10-CM

## 2022-01-13 DIAGNOSIS — E119 Type 2 diabetes mellitus without complications: Secondary | ICD-10-CM

## 2022-01-13 HISTORY — DX: Type 2 diabetes mellitus without complications: E11.9

## 2022-01-13 MED ORDER — SPIRONOLACTONE 25 MG PO TABS: 25.0000 mg | ORAL_TABLET | Freq: Every day | ORAL | 3 refills | Status: DC

## 2022-01-13 NOTE — Patient Instructions (Signed)
Medication Instructions:  Your physician has recommended you make the following change in your medication:  START: spironolactone (Aldactone) 25 mg by mouth once daily  *If you need a refill on your cardiac medications before your next appointment, please call your pharmacy*   Lab Work: TODAY: BMP, Mg If you have labs (blood work) drawn today and your tests are completely normal, you will receive your results only by: MyChart Message (if you have MyChart) OR A paper copy in the mail If you have any lab test that is abnormal or we need to change your treatment, we will call you to review the results.   Testing/Procedures: Your physician has requested that you have an echocardiogram. Echocardiography is a painless test that uses sound waves to create images of your heart. It provides your doctor with information about the size and shape of your heart and how well your heart's chambers and valves are working. This procedure takes approximately one hour. There are no restrictions for this procedure.    Follow-Up: At [facility] you and your health needs are our priority.  As part of our continuing mission to provide you with exceptional heart care, we have created designated Provider Care Teams.  These Care Teams include your primary Cardiologist (physician) and Advanced Practice Providers (APPs -  Physician Assistants and Nurse Practitioners) who all work together to provide you with the care you need, when you need it.  We recommend signing up for the patient portal called "MyChart".  Sign up information is provided on this After Visit Summary.  MyChart is used to connect with patients for Virtual Visits (Telemedicine).  Patients are able to view lab/test results, encounter notes, upcoming appointments, etc.  Non-urgent messages can be sent to your provider as well.   To learn more about what you can do with MyChart, go to [email or web address].    Your next appointment:   6-8  week(s)  The format for your next appointment:   In Person  Provider:   Christell Constant, MD     Other Instructions   Important Information About Sugar

## 2022-01-13 NOTE — Progress Notes (Signed)
Cardiology Office Note:    Date:  01/13/2022   ID:  Lynn Andrade, DOB 1970-09-19, MRN [id number]  PCP:  Jettie Pagan, NP   [facility] Providers Cardiologist:  Christell Constant, MD     Referring MD: Jettie Pagan, NP   CC: LE swelling Consulted for the evaluation of edema at the behest of Ms. Falstreau  History of Present Illness:    Lynn Andrade is a 51 y.o. female with a hx of HTN, DM, who presents for evaluation.  Hx of morbid obesity with prior weight loss surgery (duodenal switch). Went from 450- 255; during COVID to 319.    Patient had no issues four weeks ago.  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 Had Ebstein barr in later winter, early spring.  No prior MI.  Dry weight is 319 lb.  Her leg swelling had increase significantly in the past.  When she came in to PCP she had 340 lbs.    Has had no chest pain, chest pressure, chest tightness, chest stinging  Patient exertion notable for doing hair and take care of her two children and feels no symptoms.    No shortness of breath, DOE .  No PND or orthopnea.  Started lasix and metolazone and has already lost 8 lbs on her scale.No syncope or near syncope . Notes  no palpitations or funny heart beats.     Patient reports prior cardiac testing including normal EKG, normal BNP, normal kidney function, normal liver function, no blood clots.    On exam she has a new systolic heart murmur; she said that was new from 8 weeks ago.   Past Medical History:  Diagnosis Date   Acid reflux    Diabetes mellitus without complication (HCC)    Edema    Fluid retention    Hypertension     Past Surgical History:  Procedure Laterality Date   BILIOPANCREATIC DIVISION W DUODENAL SWITCH     CHOLECYSTECTOMY     DILATION AND CURETTAGE OF UTERUS     TONSILLECTOMY      Current Medications: Current Meds  Medication Sig   Biotin 10 MG CAPS Take by mouth.   cyanocobalamin 1000 MCG tablet Take by mouth.    FEROSUL 325 (65 Fe) MG tablet Take 325 mg by mouth 2 (two) times daily.   furosemide (LASIX) 40 MG tablet Take 40 mg by mouth 2 (two) times daily.   levocetirizine (XYZAL) 5 MG tablet Take 5 mg by mouth every evening.   metolazone (ZAROXOLYN) 5 MG tablet Take 5 mg by mouth daily at 6 (six) AM.   omeprazole (PRILOSEC) 10 MG capsule Take 10 mg by mouth daily.   spironolactone (ALDACTONE) 25 MG tablet Take 1 tablet (25 mg total) by mouth daily.   triamcinolone (NASACORT ALLERGY 24HR) 55 MCG/ACT AERO nasal inhaler Place 2 sprays into the nose daily.   vitamin A 3 MG (10000 UNITS) capsule Take by mouth daily.     Allergies:   Aspirin-caffeine, Etodolac, and Toradol [ketorolac tromethamine]   Social History   Socioeconomic History   Marital status: Single    Spouse name: Not on file   Number of children: Not on file   Years of education: Not on file   Highest education level: Not on file  Occupational History   Not on file  Tobacco Use   Smoking status: Every Day    Packs/day: 1.00    Types: Cigarettes   Smokeless tobacco: Never  Vaping Use   Vaping Use: Never used  Substance and Sexual Activity   Alcohol use: Yes   Drug use: No   Sexual activity: Yes    Birth control/protection: None  Other Topics Concern   Not on file  Social History Narrative   Not on file   Social Determinants of Health   Financial Resource Strain: Not on file  Food Insecurity: Not on file  Transportation Needs: Not on file  Physical Activity: Not on file  Stress: Not on file  Social Connections: Not on file     Family History: The patient's family history includes Bladder Cancer in her mother; Breast cancer in her mother; Diabetes in her father and mother; Hypertension in her mother; Kidney cancer in her mother. Sister has atrial fibrillation. Father has silent MI  ROS:   Please see the history of present illness.     All other systems reviewed and are negative.  EKGs/Labs/Other Studies  Reviewed:    The following studies were reviewed today:  EKG:  EKG is  ordered today.  The ekg ordered today demonstrates  01/13/22: SR  Recent Labs: No results found for requested labs within last 365 days.  Recent Lipid Panel No results found for: "CHOL", "TRIG", "HDL", "CHOLHDL", "VLDL", "LDLCALC", "LDLDIRECT"   Physical Exam:    VS:  BP (!) 144/81   Pulse 75   Ht 5' 5.5" (1.664 m)   Wt (!) 338 lb 12.8 oz (153.7 kg)   SpO2 98%   BMI 55.52 kg/m     Wt Readings from Last 3 Encounters:  01/13/22 (!) 338 lb 12.8 oz (153.7 kg)  11/26/21 (!) 319 lb (144.7 kg)  11/09/21 (!) 319 lb (144.7 kg)    Gen: no distress, morbid obesity  Cardiac: No Rubs or Gallops, holosystolic murmur, RRR , +2 radial pulses Respiratory: Clear to auscultation bilaterally, normal effort, normal  respiratory rate GI: Soft, nontender, non-distended  MS: +2 bilateral  edema; moves all extremities Integument: Skin feels warm Neuro:  At time of evaluation, alert and oriented to person/place/time/situation  Psych: Normal affect, patient feels OK   ASSESSMENT:    1. Heart failure, type unknown (HCC)   2. Newly recognized heart murmur   3. Diabetes mellitus with coincident hypertension (HCC)   4. Morbid obesity (HCC)    PLAN:    HF NOS HTN with DM LE edema New heart murmur Morbid obesity with prior duodenal switch - she has made significant progress on lasix 40 mg PO BID and metolazone 5 mg  - she is at high risk of electrolyte abnormalities; will get BMP and Mg today; adding spironolactone 25 mg Po daily; may needs labs in one week based on results; may need increased K - next med would be SGLT2i - will get echo  Would benefit from close f/u (6-8 weeks) me or APP; if sgnificant MR or new HF would be more aggressive eval       Medication Adjustments/Labs and Tests Ordered: Current medicines are reviewed at length with the patient today.  Concerns regarding medicines are outlined above.  Orders  Placed This Encounter  Procedures   Basic metabolic panel   Magnesium   ECHOCARDIOGRAM COMPLETE   Meds ordered this encounter  Medications   spironolactone (ALDACTONE) 25 MG tablet    Sig: Take 1 tablet (25 mg total) by mouth daily.    Dispense:  90 tablet    Refill:  3    Patient Instructions  Medication Instructions:  Your physician has recommended you make the following change in your medication:  START: spironolactone (Aldactone) 25 mg by mouth once daily  *If you need a refill on your cardiac medications before your next appointment, please call your pharmacy*   Lab Work: TODAY: BMP, Mg If you have labs (blood work) drawn today and your tests are completely normal, you will receive your results only by: MyChart Message (if you have MyChart) OR A paper copy in the mail If you have any lab test that is abnormal or we need to change your treatment, we will call you to review the results.   Testing/Procedures: Your physician has requested that you have an echocardiogram. Echocardiography is a painless test that uses sound waves to create images of your heart. It provides your doctor with information about the size and shape of your heart and how well your heart's chambers and valves are working. This procedure takes approximately one hour. There are no restrictions for this procedure.    Follow-Up: At [facility], you and your health needs are our priority.  As part of our continuing mission to provide you with exceptional heart care, we have created designated Provider Care Teams.  These Care Teams include your primary Cardiologist (physician) and Advanced Practice Providers (APPs -  Physician Assistants and Nurse Practitioners) who all work together to provide you with the care you need, when you need it.  We recommend signing up for the patient portal called "MyChart".  Sign up information is provided on this After Visit Summary.  MyChart is used to connect with  patients for Virtual Visits (Telemedicine).  Patients are able to view lab/test results, encounter notes, upcoming appointments, etc.  Non-urgent messages can be sent to your provider as well.   To learn more about what you can do with MyChart, go to [email or web address].    Your next appointment:   6-8 week(s)  The format for your next appointment:   In Person  Provider:   Christell Constant, MD     Other Instructions   Important Information About Sugar         Signed, Christell Constant, MD  01/13/2022 3:22 PM    [facility]

## 2022-01-14 LAB — BASIC METABOLIC PANEL
BUN/Creatinine Ratio: 14 (ref 9–23)
BUN: 14 mg/dL (ref 6–24)
CO2: 24 mmol/L (ref 20–29)
Calcium: 9.5 mg/dL (ref 8.7–10.2)
Chloride: 106 mmol/L (ref 96–106)
Creatinine, Ser: 1.02 mg/dL — ABNORMAL HIGH (ref 0.57–1.00)
Glucose: 78 mg/dL (ref 70–99)
Potassium: 4.1 mmol/L (ref 3.5–5.2)
Sodium: 145 mmol/L — ABNORMAL HIGH (ref 134–144)
eGFR: 67 mL/min/{1.73_m2} (ref >59)

## 2022-01-14 LAB — MAGNESIUM: Magnesium: 2.3 mg/dL (ref 1.6–2.3)

## 2022-01-14 NOTE — Addendum Note (Signed)
Addended by: Daleen Bo I on: 01/14/2022 08:59 PM   Modules accepted: Orders

## 2022-01-19 ENCOUNTER — Telehealth: Payer: Self-pay | Admitting: Internal Medicine

## 2022-01-19 NOTE — Telephone Encounter (Signed)
Pt is returning call for lab results. Requesting call back.

## 2022-01-20 NOTE — Telephone Encounter (Signed)
Left a message to call back earlier today.

## 2022-01-24 ENCOUNTER — Emergency Department (HOSPITAL_BASED_OUTPATIENT_CLINIC_OR_DEPARTMENT_OTHER): Payer: 59 | Admitting: Radiology

## 2022-01-24 ENCOUNTER — Other Ambulatory Visit: Payer: Self-pay

## 2022-01-24 ENCOUNTER — Encounter (HOSPITAL_BASED_OUTPATIENT_CLINIC_OR_DEPARTMENT_OTHER): Payer: Self-pay

## 2022-01-24 ENCOUNTER — Emergency Department (HOSPITAL_BASED_OUTPATIENT_CLINIC_OR_DEPARTMENT_OTHER)
Admission: EM | Admit: 2022-01-24 | Discharge: 2022-01-25 | Disposition: A | Discharge status: Home / Self Care | Payer: 59 | Source: Home / Self Care | Attending: Emergency Medicine | Admitting: Emergency Medicine

## 2022-01-24 ENCOUNTER — Emergency Department (HOSPITAL_BASED_OUTPATIENT_CLINIC_OR_DEPARTMENT_OTHER): Payer: 59

## 2022-01-24 DIAGNOSIS — G9341 Metabolic encephalopathy: Secondary | ICD-10-CM | POA: Diagnosis not present

## 2022-01-24 DIAGNOSIS — R600 Localized edema: Secondary | ICD-10-CM | POA: Diagnosis not present

## 2022-01-24 DIAGNOSIS — R4182 Altered mental status, unspecified: Secondary | ICD-10-CM | POA: Diagnosis present

## 2022-01-24 DIAGNOSIS — E1165 Type 2 diabetes mellitus with hyperglycemia: Secondary | ICD-10-CM | POA: Diagnosis not present

## 2022-01-24 DIAGNOSIS — K7290 Hepatic failure, unspecified without coma: Secondary | ICD-10-CM | POA: Diagnosis not present

## 2022-01-24 DIAGNOSIS — I5030 Unspecified diastolic (congestive) heart failure: Secondary | ICD-10-CM | POA: Diagnosis not present

## 2022-01-24 DIAGNOSIS — I110 Hypertensive heart disease with heart failure: Secondary | ICD-10-CM | POA: Diagnosis not present

## 2022-01-24 DIAGNOSIS — R682 Dry mouth, unspecified: Secondary | ICD-10-CM | POA: Insufficient documentation

## 2022-01-24 DIAGNOSIS — R631 Polydipsia: Secondary | ICD-10-CM | POA: Insufficient documentation

## 2022-01-24 DIAGNOSIS — R1110 Vomiting, unspecified: Secondary | ICD-10-CM | POA: Insufficient documentation

## 2022-01-24 DIAGNOSIS — R42 Dizziness and giddiness: Secondary | ICD-10-CM | POA: Insufficient documentation

## 2022-01-24 DIAGNOSIS — F17210 Nicotine dependence, cigarettes, uncomplicated: Secondary | ICD-10-CM | POA: Diagnosis not present

## 2022-01-24 DIAGNOSIS — E876 Hypokalemia: Secondary | ICD-10-CM | POA: Diagnosis not present

## 2022-01-24 DIAGNOSIS — R739 Hyperglycemia, unspecified: Secondary | ICD-10-CM | POA: Insufficient documentation

## 2022-01-24 DIAGNOSIS — E7220 Disorder of urea cycle metabolism, unspecified: Secondary | ICD-10-CM | POA: Diagnosis not present

## 2022-01-24 DIAGNOSIS — R5383 Other fatigue: Secondary | ICD-10-CM | POA: Insufficient documentation

## 2022-01-24 DIAGNOSIS — Z79899 Other long term (current) drug therapy: Secondary | ICD-10-CM | POA: Diagnosis not present

## 2022-01-24 DIAGNOSIS — K7682 Hepatic encephalopathy: Secondary | ICD-10-CM | POA: Diagnosis not present

## 2022-01-24 DIAGNOSIS — R634 Abnormal weight loss: Secondary | ICD-10-CM | POA: Diagnosis not present

## 2022-01-24 LAB — CBC WITH DIFFERENTIAL/PLATELET
Abs Immature Granulocytes: 0.02 10*3/uL (ref 0.00–0.07)
Basophils Absolute: 0 10*3/uL (ref 0.0–0.1)
Basophils Relative: 1 %
Eosinophils Absolute: 0.1 10*3/uL (ref 0.0–0.5)
Eosinophils Relative: 1 %
HCT: 40.8 % (ref 36.0–46.0)
Hemoglobin: 13.5 g/dL (ref 12.0–15.0)
Immature Granulocytes: 0 %
Lymphocytes Relative: 27 %
Lymphs Abs: 1.6 10*3/uL (ref 0.7–4.0)
MCH: 32.8 pg (ref 26.0–34.0)
MCHC: 33.1 g/dL (ref 30.0–36.0)
MCV: 99 fL (ref 80.0–100.0)
Monocytes Absolute: 0.6 10*3/uL (ref 0.1–1.0)
Monocytes Relative: 10 %
Neutro Abs: 3.5 10*3/uL (ref 1.7–7.7)
Neutrophils Relative %: 61 %
Platelets: 198 10*3/uL (ref 150–400)
RBC: 4.12 MIL/uL (ref 3.87–5.11)
RDW: 11.9 % (ref 11.5–15.5)
WBC: 5.8 10*3/uL (ref 4.0–10.5)
nRBC: 0 % (ref 0.0–0.2)

## 2022-01-24 LAB — COMPREHENSIVE METABOLIC PANEL
ALT: 24 U/L (ref 0–44)
AST: 32 U/L (ref 15–41)
Albumin: 4.4 g/dL (ref 3.5–5.0)
Alkaline Phosphatase: 77 U/L (ref 38–126)
Anion gap: 11 (ref 5–15)
BUN: 21 mg/dL — ABNORMAL HIGH (ref 6–20)
CO2: 25 mmol/L (ref 22–32)
Calcium: 9.9 mg/dL (ref 8.9–10.3)
Chloride: 105 mmol/L (ref 98–111)
Creatinine, Ser: 0.96 mg/dL (ref 0.44–1.00)
GFR, Estimated: >60 mL/min (ref >60)
Glucose, Bld: 207 mg/dL — ABNORMAL HIGH (ref 70–99)
Potassium: 3.3 mmol/L — ABNORMAL LOW (ref 3.5–5.1)
Sodium: 141 mmol/L (ref 135–145)
Total Bilirubin: 1.1 mg/dL (ref 0.3–1.2)
Total Protein: 7.3 g/dL (ref 6.5–8.1)

## 2022-01-24 LAB — URINALYSIS, ROUTINE W REFLEX MICROSCOPIC
Bilirubin Urine: NEGATIVE
Glucose, UA: NEGATIVE mg/dL
Hgb urine dipstick: NEGATIVE
Ketones, ur: NEGATIVE mg/dL
Leukocytes,Ua: NEGATIVE
Nitrite: NEGATIVE
Specific Gravity, Urine: 1.027 (ref 1.005–1.030)
pH: 6 (ref 5.0–8.0)

## 2022-01-24 LAB — BRAIN NATRIURETIC PEPTIDE: B Natriuretic Peptide: 23.7 pg/mL (ref 0.0–100.0)

## 2022-01-24 LAB — TSH: TSH: 2.193 u[IU]/mL (ref 0.350–4.500)

## 2022-01-24 LAB — MAGNESIUM: Magnesium: 2.4 mg/dL (ref 1.7–2.4)

## 2022-01-24 MED ORDER — SODIUM CHLORIDE 0.9 % IV BOLUS
500.0000 mL | Freq: Once | INTRAVENOUS | Status: AC
  Administered 2022-01-24: 500 mL via INTRAVENOUS

## 2022-01-24 NOTE — ED Provider Notes (Signed)
[facility] EMERGENCY DEPT Provider Note   CSN: [id number] Arrival date & time: 01/24/22  1627     History  Chief Complaint  Patient presents with   Fatigue    Lynn Andrade is a 51 y.o. female who presents emergency department with concerns for fatigue has been increasing since yesterday.  Has associated vomiting, increased thirst, dry mouth, dizziness.  Patient has been on several antidiabetics including Lasix and metolazone.  Also takes semaglutide.  She also voices concern for weight loss noting that she is lost a significant amount of weight since the end of August.  Was started on spironolactone by her cardiologist recently.  Has taken over-the-counter Emetrol for her nausea with relief of her symptoms.  Has a history of UTI however denies history of kidney stones.  Denies fever, abdominal pain, nausea, urinary symptoms, flank pain, back pain.    The history is provided by the patient. No language interpreter was used.       Home Medications Prior to Admission medications   Medication Sig Start Date End Date Taking? Authorizing Provider  Biotin 10 MG CAPS Take by mouth.    [provider]  cyanocobalamin 1000 MCG tablet Take by mouth.    [provider]  FEROSUL 325 (65 Fe) MG tablet Take 325 mg by mouth 2 (two) times daily. 02/28/21   [provider]  furosemide (LASIX) 40 MG tablet Take 40 mg by mouth 2 (two) times daily. 12/22/21   [provider]  levocetirizine (XYZAL) 5 MG tablet Take 5 mg by mouth every evening.    [provider]  metolazone (ZAROXOLYN) 5 MG tablet Take 5 mg by mouth daily at 6 (six) AM. 01/11/22   [provider]  omeprazole (PRILOSEC) 10 MG capsule Take 10 mg by mouth daily.    [provider]  spironolactone (ALDACTONE) 25 MG tablet Take 1 tablet (25 mg total) by mouth daily. 01/13/22   Chandrasekhar, Terisa Starr, MD  triamcinolone (NASACORT ALLERGY 24HR) 55 MCG/ACT AERO nasal  inhaler Place 2 sprays into the nose daily.    [provider]  vitamin A 3 MG (10000 UNITS) capsule Take by mouth daily.    [provider]      Allergies    Aspirin-caffeine, Etodolac, and Toradol [ketorolac tromethamine]    Review of Systems   Review of Systems  Constitutional:  Positive for appetite change. Negative for fever.  Gastrointestinal:  Positive for vomiting. Negative for abdominal pain and nausea.  Genitourinary:  Negative for dysuria, flank pain and hematuria.  Musculoskeletal:  Negative for back pain.  All other systems reviewed and are negative.   Physical Exam Updated Vital Signs BP 131/70   Pulse 79   Temp 98.9 F (37.2 C) (Oral)   Resp (!) 23   Ht 5' 5.5" (1.664 m)   Wt 127.1 kg   SpO2 94%   BMI 45.92 kg/m  Physical Exam Vitals and nursing note reviewed.  Constitutional:      General: She is not in acute distress.    Appearance: She is not diaphoretic.  HENT:     Head: Normocephalic and atraumatic.     Mouth/Throat:     Pharynx: No oropharyngeal exudate.  Eyes:     General: No scleral icterus.    Conjunctiva/sclera: Conjunctivae normal.  Cardiovascular:     Rate and Rhythm: Normal rate and regular rhythm.     Pulses: Normal pulses.     Heart sounds: Normal heart sounds.  Pulmonary:     Effort: Pulmonary effort is normal. No respiratory distress.     Breath sounds: Normal breath sounds. No wheezing.  Abdominal:     General: Bowel sounds are normal.     Palpations: Abdomen is soft. There is no mass.     Tenderness: There is no abdominal tenderness. There is no guarding or rebound.  Musculoskeletal:        General: Normal range of motion.     Cervical back: Normal range of motion and neck supple.     Comments: No edema noted to bilateral lower extremities.  Skin:    General: Skin is warm and dry.  Neurological:     Mental Status: She is alert.  Psychiatric:        Behavior: Behavior normal.     ED Results / Procedures  / Treatments   Labs (all labs ordered are listed, but only abnormal results are displayed) Labs Reviewed  COMPREHENSIVE METABOLIC PANEL - Abnormal; Notable for the following components:      Result Value   Potassium 3.3 (*)    Glucose, Bld 207 (*)    BUN 21 (*)    All other components within normal limits  CBC WITH DIFFERENTIAL/PLATELET  MAGNESIUM  BRAIN NATRIURETIC PEPTIDE  TSH  URINALYSIS, ROUTINE W REFLEX MICROSCOPIC  CBG MONITORING, ED  TROPONIN I (HIGH SENSITIVITY)    EKG EKG Interpretation  Date/Time:  Sunday January 24 2022 22:04:06 EDT Ventricular Rate:  79 PR Interval:  140 QRS Duration: 100 QT Interval:  452 QTC Calculation: 519 R Axis:   66 Text Interpretation: Sinus rhythm Prolonged QT interval No previous ECGs available Confirmed by Alvira Monday ([id number]) on 01/24/2022 10:16:02 PM  Radiology DG Chest Port 1 View  Result Date: 01/24/2022 CLINICAL DATA:  Weight loss, fatigue EXAM: PORTABLE CHEST 1 VIEW COMPARISON:  07/15/2021 FINDINGS: The heart size and mediastinal contours are within normal limits. Both lungs are clear. The visualized skeletal structures are unremarkable. IMPRESSION: No active disease. Electronically Signed   By: Charlett Nose M.D.   On: 01/24/2022 22:02    Procedures Procedures    Medications Ordered in ED Medications  sodium chloride 0.9 % bolus 500 mL (0 mLs Intravenous Stopped 01/24/22 2155)    ED Course/ Medical Decision Making/ A&P                           Medical Decision Making Amount and/or Complexity of Data Reviewed Labs: ordered. Radiology: ordered.   Pt presents with concerns for fatigue.  Also notes that she has concerns for weight loss.  Per patient chart review, patient's weight on 01/13/2022 was 338 pounds, today's weight is 280 pounds.  Between then patient has been taking her semaglutide, Lasix (40 mg), as well as started taking 5 mg of metolazone on 01/11/2022.  Patient had her metolazone discontinued by her  primary care provider due to her symptoms.  Vital signs, afebrile. On exam, pt with no concerns for fluid overload today.  No pitting edema noted.. No acute cardiovascular, respiratory, abdominal exam findings.  Patient is scheduled for her first echocardiogram with her cardiologist on next week.  Differential diagnosis includes CHF, arrhythmia, anemia, hypoglycemia, thyroid abnormality, electrolyte abnormality.    Labs:  I ordered, and personally interpreted labs.  The pertinent results include:   TSH, troponin, CBG, urinalysis ordered with results pending at time of signout. BMP unremarkable. Magnesium unremarkable. CBC unremarkable. CMP with slightly elevated glucose of  207 otherwise unremarkable.   Imaging: I ordered imaging studies including chest x-ray I independently visualized and interpreted imaging which showed: No acute findings I agree with the radiologist interpretation  Medications:  I ordered medication including IVF for symptom management   Patient case discussed with Dr. Dalene Seltzer, t sign-out. Plan at sign-out is pending labs, likely Discharge home, however, plans may change as per oncoming team. Patient care transferred at sign out.     This chart was dictated using voice recognition software, Dragon. Despite the best efforts of this provider to proofread and correct errors, errors may still occur which can change documentation meaning.   Final Clinical Impression(s) / ED Diagnoses Final diagnoses:  None    Rx / DC Orders ED Discharge Orders     None         Julya Alioto A, PA-C 01/24/22 2241    Alvira Monday, MD 01/25/22 2252

## 2022-01-24 NOTE — ED Triage Notes (Signed)
Pt states she has lost 65 lbs since August 22. Pt was put on lasix, spirolactone and metolazone at that time. Pt states that since yesterday, she has been very fatigued and worn down. Pt is concerned that she is dehydrated. Pt also adds that she has thrown up 3 times.

## 2022-01-25 ENCOUNTER — Encounter (HOSPITAL_COMMUNITY): Payer: Self-pay

## 2022-01-25 ENCOUNTER — Observation Stay (HOSPITAL_BASED_OUTPATIENT_CLINIC_OR_DEPARTMENT_OTHER)
Admission: EM | Admit: 2022-01-25 | Discharge: 2022-01-27 | Disposition: A | Discharge status: Home / Self Care | Payer: 59 | Attending: Internal Medicine | Admitting: Internal Medicine

## 2022-01-25 ENCOUNTER — Encounter (HOSPITAL_BASED_OUTPATIENT_CLINIC_OR_DEPARTMENT_OTHER): Payer: Self-pay

## 2022-01-25 ENCOUNTER — Emergency Department (HOSPITAL_BASED_OUTPATIENT_CLINIC_OR_DEPARTMENT_OTHER): Payer: 59 | Admitting: Radiology

## 2022-01-25 ENCOUNTER — Emergency Department (HOSPITAL_BASED_OUTPATIENT_CLINIC_OR_DEPARTMENT_OTHER): Payer: 59

## 2022-01-25 ENCOUNTER — Other Ambulatory Visit: Payer: Self-pay

## 2022-01-25 DIAGNOSIS — R4182 Altered mental status, unspecified: Secondary | ICD-10-CM | POA: Diagnosis not present

## 2022-01-25 DIAGNOSIS — K7682 Hepatic encephalopathy: Secondary | ICD-10-CM | POA: Diagnosis not present

## 2022-01-25 DIAGNOSIS — G9341 Metabolic encephalopathy: Secondary | ICD-10-CM | POA: Diagnosis not present

## 2022-01-25 DIAGNOSIS — R600 Localized edema: Secondary | ICD-10-CM | POA: Insufficient documentation

## 2022-01-25 DIAGNOSIS — K219 Gastro-esophageal reflux disease without esophagitis: Secondary | ICD-10-CM | POA: Diagnosis not present

## 2022-01-25 DIAGNOSIS — T59891A Toxic effect of other specified gases, fumes and vapors, accidental (unintentional), initial encounter: Secondary | ICD-10-CM

## 2022-01-25 DIAGNOSIS — I509 Heart failure, unspecified: Secondary | ICD-10-CM

## 2022-01-25 DIAGNOSIS — I10 Essential (primary) hypertension: Secondary | ICD-10-CM | POA: Diagnosis present

## 2022-01-25 DIAGNOSIS — Z79899 Other long term (current) drug therapy: Secondary | ICD-10-CM | POA: Diagnosis not present

## 2022-01-25 DIAGNOSIS — R42 Dizziness and giddiness: Secondary | ICD-10-CM | POA: Diagnosis not present

## 2022-01-25 DIAGNOSIS — I110 Hypertensive heart disease with heart failure: Secondary | ICD-10-CM | POA: Diagnosis not present

## 2022-01-25 DIAGNOSIS — R682 Dry mouth, unspecified: Secondary | ICD-10-CM | POA: Insufficient documentation

## 2022-01-25 DIAGNOSIS — E876 Hypokalemia: Secondary | ICD-10-CM | POA: Insufficient documentation

## 2022-01-25 DIAGNOSIS — E785 Hyperlipidemia, unspecified: Secondary | ICD-10-CM | POA: Diagnosis not present

## 2022-01-25 DIAGNOSIS — R5383 Other fatigue: Secondary | ICD-10-CM | POA: Diagnosis not present

## 2022-01-25 DIAGNOSIS — E7220 Disorder of urea cycle metabolism, unspecified: Secondary | ICD-10-CM | POA: Diagnosis not present

## 2022-01-25 DIAGNOSIS — E1165 Type 2 diabetes mellitus with hyperglycemia: Secondary | ICD-10-CM | POA: Diagnosis not present

## 2022-01-25 DIAGNOSIS — Z9884 Bariatric surgery status: Secondary | ICD-10-CM

## 2022-01-25 DIAGNOSIS — F17210 Nicotine dependence, cigarettes, uncomplicated: Secondary | ICD-10-CM | POA: Insufficient documentation

## 2022-01-25 DIAGNOSIS — I5030 Unspecified diastolic (congestive) heart failure: Secondary | ICD-10-CM | POA: Insufficient documentation

## 2022-01-25 DIAGNOSIS — R1110 Vomiting, unspecified: Secondary | ICD-10-CM | POA: Diagnosis not present

## 2022-01-25 DIAGNOSIS — G928 Other toxic encephalopathy: Secondary | ICD-10-CM | POA: Diagnosis not present

## 2022-01-25 DIAGNOSIS — E119 Type 2 diabetes mellitus without complications: Secondary | ICD-10-CM

## 2022-01-25 DIAGNOSIS — R410 Disorientation, unspecified: Secondary | ICD-10-CM | POA: Diagnosis not present

## 2022-01-25 DIAGNOSIS — R519 Headache, unspecified: Secondary | ICD-10-CM | POA: Diagnosis not present

## 2022-01-25 DIAGNOSIS — G4733 Obstructive sleep apnea (adult) (pediatric): Secondary | ICD-10-CM | POA: Diagnosis present

## 2022-01-25 DIAGNOSIS — K7290 Hepatic failure, unspecified without coma: Secondary | ICD-10-CM | POA: Diagnosis not present

## 2022-01-25 LAB — CBC
HCT: 41.5 % (ref 36.0–46.0)
Hemoglobin: 13.7 g/dL (ref 12.0–15.0)
MCH: 32.7 pg (ref 26.0–34.0)
MCHC: 33 g/dL (ref 30.0–36.0)
MCV: 99 fL (ref 80.0–100.0)
Platelets: 183 10*3/uL (ref 150–400)
RBC: 4.19 MIL/uL (ref 3.87–5.11)
RDW: 11.9 % (ref 11.5–15.5)
WBC: 7.1 10*3/uL (ref 4.0–10.5)
nRBC: 0 % (ref 0.0–0.2)

## 2022-01-25 LAB — URINALYSIS, ROUTINE W REFLEX MICROSCOPIC
Glucose, UA: NEGATIVE mg/dL
Hgb urine dipstick: NEGATIVE
Ketones, ur: NEGATIVE mg/dL
Nitrite: NEGATIVE
Protein, ur: 30 mg/dL — AB
Specific Gravity, Urine: 1.034 — ABNORMAL HIGH (ref 1.005–1.030)
pH: 5.5 (ref 5.0–8.0)

## 2022-01-25 LAB — ACETAMINOPHEN LEVEL: Acetaminophen (Tylenol), Serum: <10 ug/mL — ABNORMAL LOW (ref 10–30)

## 2022-01-25 LAB — BASIC METABOLIC PANEL
Anion gap: 12 (ref 5–15)
BUN: 20 mg/dL (ref 6–20)
CO2: 23 mmol/L (ref 22–32)
Calcium: 9.8 mg/dL (ref 8.9–10.3)
Chloride: 102 mmol/L (ref 98–111)
Creatinine, Ser: 0.93 mg/dL (ref 0.44–1.00)
GFR, Estimated: >60 mL/min (ref >60)
Glucose, Bld: 152 mg/dL — ABNORMAL HIGH (ref 70–99)
Potassium: 3.9 mmol/L (ref 3.5–5.1)
Sodium: 137 mmol/L (ref 135–145)

## 2022-01-25 LAB — HEPATIC FUNCTION PANEL
ALT: 26 U/L (ref 0–44)
AST: 27 U/L (ref 15–41)
Albumin: 4.2 g/dL (ref 3.5–5.0)
Alkaline Phosphatase: 62 U/L (ref 38–126)
Bilirubin, Direct: 0.5 mg/dL — ABNORMAL HIGH (ref 0.0–0.2)
Indirect Bilirubin: 0.8 mg/dL (ref 0.3–0.9)
Total Bilirubin: 1.3 mg/dL — ABNORMAL HIGH (ref 0.3–1.2)
Total Protein: 7 g/dL (ref 6.5–8.1)

## 2022-01-25 LAB — TSH: TSH: 1.943 u[IU]/mL (ref 0.350–4.500)

## 2022-01-25 LAB — RAPID URINE DRUG SCREEN, HOSP PERFORMED
Amphetamines: NOT DETECTED
Barbiturates: NOT DETECTED
Benzodiazepines: NOT DETECTED
Cocaine: NOT DETECTED
Opiates: NOT DETECTED
Tetrahydrocannabinol: NOT DETECTED

## 2022-01-25 LAB — CBG MONITORING, ED
Glucose-Capillary: 190 mg/dL — ABNORMAL HIGH (ref 70–99)
Glucose-Capillary: 138 mg/dL — ABNORMAL HIGH (ref 70–99)

## 2022-01-25 LAB — PROTIME-INR
INR: 1.1 (ref 0.8–1.2)
Prothrombin Time: 13.6 seconds (ref 11.4–15.2)

## 2022-01-25 LAB — SALICYLATE LEVEL: Salicylate Lvl: <7 mg/dL — ABNORMAL LOW (ref 7.0–30.0)

## 2022-01-25 LAB — ETHANOL: Alcohol, Ethyl (B): <10 mg/dL (ref <10)

## 2022-01-25 LAB — TROPONIN I (HIGH SENSITIVITY)
Troponin I (High Sensitivity): 7 ng/L (ref <18)
Troponin I (High Sensitivity): 5 ng/L (ref <18)

## 2022-01-25 LAB — AMMONIA: Ammonia: 124 umol/L — ABNORMAL HIGH (ref 9–35)

## 2022-01-25 LAB — BRAIN NATRIURETIC PEPTIDE: B Natriuretic Peptide: 25.5 pg/mL (ref 0.0–100.0)

## 2022-01-25 MED ORDER — ORAL CARE MOUTH RINSE: 15.0000 mL | OROMUCOSAL | Status: DC | PRN

## 2022-01-25 MED ORDER — SODIUM CHLORIDE 0.9% FLUSH
3.0000 mL | Freq: Two times a day (BID) | INTRAVENOUS | Status: DC
  Administered 2022-01-25 – 2022-01-27 (×4): 3 mL via INTRAVENOUS

## 2022-01-25 MED ORDER — POTASSIUM CHLORIDE CRYS ER 20 MEQ PO TBCR
20.0000 meq | EXTENDED_RELEASE_TABLET | Freq: Once | ORAL | Status: AC
  Administered 2022-01-25: 20 meq via ORAL
  Filled 2022-01-25: qty 1

## 2022-01-25 MED ORDER — ACETAMINOPHEN 325 MG PO TABS: 650.0000 mg | ORAL_TABLET | Freq: Four times a day (QID) | ORAL | Status: DC | PRN

## 2022-01-25 MED ORDER — LACTULOSE 10 GM/15ML PO SOLN
30.0000 g | Freq: Once | ORAL | Status: AC
  Administered 2022-01-25: 30 g via ORAL
  Filled 2022-01-25: qty 60

## 2022-01-25 MED ORDER — ACETAMINOPHEN 650 MG RE SUPP: 650.0000 mg | Freq: Four times a day (QID) | RECTAL | Status: DC | PRN

## 2022-01-25 MED ORDER — SPIRONOLACTONE 25 MG PO TABS
25.0000 mg | ORAL_TABLET | Freq: Every day | ORAL | Status: DC
  Administered 2022-01-26: 25 mg via ORAL
  Filled 2022-01-25: qty 1

## 2022-01-25 MED ORDER — PANTOPRAZOLE SODIUM 40 MG PO TBEC
40.0000 mg | DELAYED_RELEASE_TABLET | Freq: Every day | ORAL | Status: DC
  Administered 2022-01-26 – 2022-01-27 (×2): 40 mg via ORAL
  Filled 2022-01-25: qty 1

## 2022-01-25 MED ORDER — LACTULOSE 10 GM/15ML PO SOLN
20.0000 g | Freq: Three times a day (TID) | ORAL | Status: DC
  Administered 2022-01-25 – 2022-01-27 (×5): 20 g via ORAL
  Filled 2022-01-25 (×5): qty 30

## 2022-01-25 MED ORDER — ENOXAPARIN SODIUM 40 MG/0.4ML IJ SOSY
40.0000 mg | PREFILLED_SYRINGE | INTRAMUSCULAR | Status: DC
  Administered 2022-01-25 – 2022-01-26 (×2): 40 mg via SUBCUTANEOUS
  Filled 2022-01-25 (×2): qty 0.4

## 2022-01-25 NOTE — ED Notes (Signed)
[facility] called to tx pt to [facility] 

## 2022-01-25 NOTE — ED Provider Notes (Signed)
[facility] EMERGENCY DEPT Provider Note   CSN: [id number] Arrival date & time: 01/25/22  1230     History  No chief complaint on file.   Lynn Andrade is a 51 y.o. female.  Level 5 caveat for altered mental status.  Patient returns after being seen yesterday.  Family reports she has been altered for the past 3 to 4 days.  She has had confusion, slurred speech, generalized fatigue and bizarre behavior at home.  Husband states she defecated on the floor, turned the stove on and did not realize its on has not been acting like herself and having slurred speech and generalized weakness.  She does not recall doing these things. There has been no fever, chills, nausea, vomiting, chest pain or shortness of breath.  She denies any pain.  She was recently diagnosed with heart failure and placed on multiple diuretics.  States compliance with his medications. [employer] recently stopped by PCP. Denies any other new exposures or medication use.  No alcohol or illicit drug use. They states she improved with IV fluids yesterday but seem to be worse again this morning with confusion, slurred speech and, not making sense when she speaks and not acting like herself.  The history is provided by the patient and the spouse. The history is limited by the condition of the patient.       Home Medications Prior to Admission medications   Medication Sig Start Date End Date Taking? Authorizing Provider  Biotin 10 MG CAPS Take by mouth.    [provider]  cyanocobalamin 1000 MCG tablet Take by mouth.    [provider]  FEROSUL 325 (65 Fe) MG tablet Take 325 mg by mouth 2 (two) times daily. 02/28/21   [provider]  furosemide (LASIX) 40 MG tablet Take 40 mg by mouth 2 (two) times daily. 12/22/21   [provider]  levocetirizine (XYZAL) 5 MG tablet Take 5 mg by mouth every evening.    [provider]  metolazone (ZAROXOLYN) 5 MG tablet Take 5 mg by  mouth daily at 6 (six) AM. 01/11/22   [provider]  omeprazole (PRILOSEC) 10 MG capsule Take 10 mg by mouth daily.    [provider]  spironolactone (ALDACTONE) 25 MG tablet Take 1 tablet (25 mg total) by mouth daily. 01/13/22   Chandrasekhar, Rondel Jumbo, MD  triamcinolone (NASACORT ALLERGY 24HR) 55 MCG/ACT AERO nasal inhaler Place 2 sprays into the nose daily.    [provider]  vitamin A 3 MG (10000 UNITS) capsule Take by mouth daily.    [provider]      Allergies    Aspirin-caffeine, Etodolac, and Toradol [ketorolac tromethamine]    Review of Systems   Review of Systems  Unable to perform ROS: Mental status change  Constitutional:  Negative for activity change, appetite change and fever.  Respiratory:  Negative for cough, chest tightness and shortness of breath.     Physical Exam Updated Vital Signs BP (!) 143/92 (BP Location: Left Arm)   Pulse 74   Temp 98.2 F (36.8 C)   Resp 20   Ht 5' 5.5" (1.664 m)   Wt 127 kg   SpO2 97%   BMI 45.89 kg/m  Physical Exam Vitals and nursing note reviewed.  Constitutional:      General: She is not in acute distress.    Appearance: She is well-developed. She is obese.     Comments: Fatigued appearing, no distress  HENT:  Head: Normocephalic and atraumatic.     Nose: No rhinorrhea.     Mouth/Throat:     Mouth: Mucous membranes are moist.     Pharynx: No oropharyngeal exudate.  Eyes:     Conjunctiva/sclera: Conjunctivae normal.     Pupils: Pupils are equal, round, and reactive to light.  Neck:     Comments: No meningismus. Cardiovascular:     Rate and Rhythm: Normal rate and regular rhythm.     Heart sounds: Normal heart sounds. No murmur heard. Pulmonary:     Effort: Pulmonary effort is normal. No respiratory distress.     Breath sounds: Normal breath sounds.  Chest:     Chest wall: No tenderness.  Abdominal:     Palpations: Abdomen is soft.     Tenderness: There is no abdominal  tenderness. There is no guarding or rebound.  Musculoskeletal:        General: No tenderness. Normal range of motion.     Cervical back: Normal range of motion and neck supple.     Right lower leg: No edema.     Left lower leg: No edema.  Skin:    General: Skin is warm.  Neurological:     Mental Status: She is alert and oriented to person, place, and time.     Cranial Nerves: No cranial nerve deficit.     Motor: No abnormal muscle tone.     Coordination: Coordination normal.     Comments:  5/5 strength throughout. CN 2-12 intact.Equal grip strength.  Moves all extremities equally, no focal deficits.  No facial droop.  Speech was mildly slow but she is oriented to person place and time.  Psychiatric:        Behavior: Behavior normal.     ED Results / Procedures / Treatments   Labs (all labs ordered are listed, but only abnormal results are displayed) Labs Reviewed  BASIC METABOLIC PANEL - Abnormal; Notable for the following components:      Result Value   Glucose, Bld 152 (*)    All other components within normal limits  ACETAMINOPHEN LEVEL - Abnormal; Notable for the following components:   Acetaminophen (Tylenol), Serum <10 (*)    All other components within normal limits  SALICYLATE LEVEL - Abnormal; Notable for the following components:   Salicylate Lvl <5.6 (*)    All other components within normal limits  AMMONIA - Abnormal; Notable for the following components:   Ammonia 124 (*)    All other components within normal limits  HEPATIC FUNCTION PANEL - Abnormal; Notable for the following components:   Total Bilirubin 1.3 (*)    Bilirubin, Direct 0.5 (*)    All other components within normal limits  CBG MONITORING, ED - Abnormal; Notable for the following components:   Glucose-Capillary 138 (*)    All other components within normal limits  CBC  ETHANOL  TSH  BRAIN NATRIURETIC PEPTIDE  URINALYSIS, ROUTINE W REFLEX MICROSCOPIC  RAPID URINE DRUG SCREEN, HOSP PERFORMED   PROTIME-INR    EKG None  Radiology CT Head Wo Contrast  Result Date: 01/25/2022 CLINICAL DATA:  Headache EXAM: CT HEAD WITHOUT CONTRAST TECHNIQUE: Contiguous axial images were obtained from the base of the skull through the vertex without intravenous contrast. RADIATION DOSE REDUCTION: This exam was performed according to the departmental dose-optimization program which includes automated exposure control, adjustment of the mA and/or kV according to patient size and/or use of iterative reconstruction technique. COMPARISON:  CT paranasal sinuses 09/23/2008 FINDINGS:  Brain: No intracranial hemorrhage, mass effect, or evidence of acute infarct. No hydrocephalus. No extra-axial fluid collection. Vascular: No hyperdense vessel or unexpected calcification. Skull: No fracture or focal lesion. Sinuses/Orbits: No acute finding. Paranasal sinuses and mastoid air cells are well aerated. Other: None. IMPRESSION: No acute intracranial abnormality. Electronically Signed   By: Minerva Fester M.D.   On: 01/25/2022 00:41   DG Chest Port 1 View  Result Date: 01/24/2022 CLINICAL DATA:  Weight loss, fatigue EXAM: PORTABLE CHEST 1 VIEW COMPARISON:  07/15/2021 FINDINGS: The heart size and mediastinal contours are within normal limits. Both lungs are clear. The visualized skeletal structures are unremarkable. IMPRESSION: No active disease. Electronically Signed   By: Charlett Nose M.D.   On: 01/24/2022 22:02    Procedures .Critical Care  Performed by: Glynn Octave, MD Authorized by: Glynn Octave, MD   Critical care provider statement:    Critical care time (minutes):  45   Critical care time was exclusive of:  Separately billable procedures and treating other patients   Critical care was necessary to treat or prevent imminent or life-threatening deterioration of the following conditions:  CNS failure or compromise   Critical care was time spent personally by me on the following activities:  Development of  treatment plan with patient or surrogate, discussions with consultants, evaluation of patient's response to treatment, examination of patient, ordering and review of laboratory studies, ordering and review of radiographic studies, ordering and performing treatments and interventions, pulse oximetry, re-evaluation of patient's condition and review of old charts   I assumed direction of critical care for this patient from another provider in my specialty: no     Care discussed with: admitting provider       Medications Ordered in ED Medications - No data to display  ED Course/ Medical Decision Making/ A&P                           Medical Decision Making Amount and/or Complexity of Data Reviewed Independent Historian: spouse Labs: ordered. Decision-making details documented in ED Course. Radiology: ordered and independent interpretation performed. Decision-making details documented in ED Course. ECG/medicine tests: ordered and independent interpretation performed. Decision-making details documented in ED Course.  Risk Prescription drug management. Decision regarding hospitalization.   Return visit for mental status change.  No fever.  Stable vitals.  Neurological exam is nonfocal. Code stroke not activated due to delay in presentation.   CT head unchanged. Results reviewed and interpreted by me. CXR clear. No volume overload.   Will evaluate for infectious, metabolic, CNS etiologies of AMS. May be medication related but no obvious offending culprits on list.   Patient found to have hyperammonemia.  No history of hepatic encephalopathy.  LFTs yesterday were normal. Will start lactulose.   Hepatic encephalopathy likely etiology of AMS. Additional labs pending. May benefit from brain MRI as well.  Admission discussed with Dr. Pola Corn.        Final Clinical Impression(s) / ED Diagnoses Final diagnoses:  Encephalopathy, hepatic [facility])    Rx / DC Orders ED Discharge Orders      None         Illene Sweeting, Jeannett Senior, MD 01/25/22 1702

## 2022-01-25 NOTE — Discharge Instructions (Addendum)
It was a pleasure caring for you today in the emergency department.  Please return to the emergency department for any worsening or worrisome symptoms.  Please follow-up with your cardiologist and with your primary care doctor Please keep your appointment for echocardiogram later this week

## 2022-01-25 NOTE — ED Notes (Signed)
Patient transported to Radiology 

## 2022-01-25 NOTE — ED Triage Notes (Signed)
Pt returns today complaining of increased fatigue. Pt states she was feeling better upon d/c yesterday but is worse today. Pt is worse appearing today comparably after triaging her yesterday, also more confused.

## 2022-01-25 NOTE — ED Notes (Signed)
MD with pt  

## 2022-01-25 NOTE — H&P (Addendum)
History and Physical   Lynn Andrade NOB:[id number] DOB: 09-21-1970 DOA: 01/25/2022  PCP: Jettie Pagan, NP   Patient coming from: Home  Chief Complaint: Altered mental status  HPI: Lynn Andrade is a 51 y.o. female with medical history significant of newly diagnosed CHF unknown type, diabetes, hypertension, obesity, GERD, depression, hyperlipidemia, bariatric surgery, OSA presenting with altered mental status.  Patient was seen yesterday for fatigue and lightheadedness which improved in the ED with IV fluids in the setting of believed over diuresis for recently diagnosed/presumed CHF.  History obtained with assistance of patient family and chart review and family reported that patient has had a change in her mentation for the past 3 to 4 days.  She has had increased confusion, slurred speech, generalized fatigue as well as some bizarre behaviors which include defecating on the floor and turning on the stove and not realizing it.  Symptoms seem to improve somewhat after IV fluids yesterday but began to worsen again this morning.  As above patient has newly diagnosed CHF which is being worked up.  She was noted to be presumably overdiuresed at PCP recently and one of her diuretics was held due to her being below her dry weight.  She also appeared to be somewhat over diuresed yesterday and improved with IV fluids as above.  Denies fevers, chills, chest pain, abdominal pain, constipation, diarrhea, nausea, vomiting.  ED Course: Vital signs in the ED significant for blood pressure in the 140s 150s systolic.  Lab work-up included CMP with glucose 152, T. bili 1.3.  CBC within normal limits.  PT and INR within normal limits.  Troponin normal x2.  BNP normal.  Ethanol level negative.  Tylenol and aspirin level normal.  Ammonia level elevated to 124.  TSH normal.  Urinalysis with bilirubin, protein, leukocytes, rare bacteria only.  UDS normal.  Chest x-ray showed cardiomegaly but no acute  abnormality.  CT head showed no acute abnormality and it did not show any abnormality on yesterday's CT head either.  Patient received a dose of lactulose in the ED.  Review of Systems: As per HPI otherwise all other systems reviewed and are negative.  Past Medical History:  Diagnosis Date   Acid reflux    Adjustment disorder with depressed mood 10/27/2009   Formatting of this note might be different from the original. Overview:  Adjustment Disorder With Depressed Mood   Diabetes mellitus with coincident hypertension (HCC) 01/13/2022   Diabetes mellitus without complication (HCC)    Edema    Essential hypertension 08/31/2010   Formatting of this note might be different from the original. Hypertension Formatting of this note might be different from the original. Overview:  Hypertension   Fluid retention    Hypertension     Past Surgical History:  Procedure Laterality Date   BILIOPANCREATIC DIVISION W DUODENAL SWITCH     CHOLECYSTECTOMY     DILATION AND CURETTAGE OF UTERUS     TONSILLECTOMY      Social History  reports that she has been smoking cigarettes. She has been smoking an average of 1 pack per day. She has never used smokeless tobacco. She reports current alcohol use. She reports that she does not use drugs.  Allergies  Allergen Reactions   Aspirin-Caffeine Hives and Swelling   Etodolac Hives   Shrimp (Diagnostic) Hives   Toradol [Ketorolac Tromethamine]     unknown    Family History  Problem Relation Age of Onset   Diabetes Mother    Hypertension Mother  Bladder Cancer Mother    Breast cancer Mother    Kidney cancer Mother    Diabetes Father   Reviewed on admission  Prior to Admission medications   Medication Sig Start Date End Date Taking? Authorizing Provider  Biotin 10 MG CAPS Take by mouth.    [provider]  cyanocobalamin 1000 MCG tablet Take by mouth.    [provider]  FEROSUL 325 (65 Fe) MG tablet Take 325 mg by mouth 2 (two)  times daily. 02/28/21   [provider]  furosemide (LASIX) 40 MG tablet Take 40 mg by mouth 2 (two) times daily. 12/22/21   [provider]  levocetirizine (XYZAL) 5 MG tablet Take 5 mg by mouth every evening.    [provider]  metolazone (ZAROXOLYN) 5 MG tablet Take 5 mg by mouth daily at 6 (six) AM. 01/11/22   [provider]  omeprazole (PRILOSEC) 10 MG capsule Take 10 mg by mouth daily.    [provider]  spironolactone (ALDACTONE) 25 MG tablet Take 1 tablet (25 mg total) by mouth daily. 01/13/22   Chandrasekhar, Terisa Starr, MD  triamcinolone (NASACORT ALLERGY 24HR) 55 MCG/ACT AERO nasal inhaler Place 2 sprays into the nose daily.    [provider]  vitamin A 3 MG (10000 UNITS) capsule Take by mouth daily.    [provider]    Physical Exam: Vitals:   01/25/22 1645 01/25/22 1700 01/25/22 1836 01/25/22 2147  BP:   (!) 141/86 136/75  Pulse: 74  72 77  Resp: (!) 22  17 18   Temp:  98.4 F (36.9 C) 98.2 F (36.8 C) 98 F (36.7 C)  TempSrc:  Oral Oral Oral  SpO2: 97%  100% 96%  Weight:      Height:        Physical Exam Constitutional:      General: She is not in acute distress.    Appearance: Normal appearance. She is obese.  HENT:     Head: Normocephalic and atraumatic.     Mouth/Throat:     Mouth: Mucous membranes are moist.     Pharynx: Oropharynx is clear.  Eyes:     Extraocular Movements: Extraocular movements intact.     Pupils: Pupils are equal, round, and reactive to light.  Cardiovascular:     Rate and Rhythm: Normal rate and regular rhythm.     Pulses: Normal pulses.     Heart sounds: Normal heart sounds.  Pulmonary:     Effort: Pulmonary effort is normal. No respiratory distress.     Breath sounds: Normal breath sounds.  Abdominal:     General: Bowel sounds are normal. There is no distension.     Palpations: Abdomen is soft.     Tenderness: There is no abdominal tenderness.  Musculoskeletal:         General: No swelling or deformity.     Right lower leg: Edema (Trace) present.     Left lower leg: Edema (Trace) present.  Skin:    General: Skin is warm and dry.  Neurological:     General: No focal deficit present.     Mental Status: Mental status is at baseline.    Labs on Admission: I have personally reviewed following labs and imaging studies  CBC: Recent Labs  Lab 01/24/22 1641 01/25/22 1303  WBC 5.8 7.1  NEUTROABS 3.5  --   HGB 13.5 13.7  HCT 40.8 41.5  MCV 99.0 99.0  PLT 198 183  Basic Metabolic Panel: Recent Labs  Lab 01/24/22 1641 01/24/22 2052 01/25/22 1303  NA 141  --  137  K 3.3*  --  3.9  CL 105  --  102  CO2 25  --  23  GLUCOSE 207*  --  152*  BUN 21*  --  20  CREATININE 0.96  --  0.93  CALCIUM 9.9  --  9.8  MG  --  2.4  --     GFR: Estimated Creatinine Clearance: 97.9 mL/min (by C-G formula based on SCr of 0.93 mg/dL).  Liver Function Tests: Recent Labs  Lab 01/24/22 1641 01/25/22 1342  AST 32 27  ALT 24 26  ALKPHOS 77 62  BILITOT 1.1 1.3*  PROT 7.3 7.0  ALBUMIN 4.4 4.2    Urine analysis:    Component Value Date/Time   COLORURINE YELLOW 01/25/2022 1633   APPEARANCEUR HAZY (A) 01/25/2022 1633   LABSPEC 1.034 (H) 01/25/2022 1633   PHURINE 5.5 01/25/2022 1633   GLUCOSEU NEGATIVE 01/25/2022 1633   HGBUR NEGATIVE 01/25/2022 1633   BILIRUBINUR SMALL (A) 01/25/2022 1633   BILIRUBINUR small (A) 11/10/2021 1716   KETONESUR NEGATIVE 01/25/2022 1633   PROTEINUR 30 (A) 01/25/2022 1633   UROBILINOGEN 2.0 (A) 11/10/2021 1716   NITRITE NEGATIVE 01/25/2022 1633   LEUKOCYTESUR TRACE (A) 01/25/2022 1633    Radiological Exams on Admission: CT Head Wo Contrast  Result Date: 01/25/2022 CLINICAL DATA:  Delirium.  Progressive fatigue. EXAM: CT HEAD WITHOUT CONTRAST TECHNIQUE: Contiguous axial images were obtained from the base of the skull through the vertex without intravenous contrast. RADIATION DOSE REDUCTION: This exam was performed  according to the departmental dose-optimization program which includes automated exposure control, adjustment of the mA and/or kV according to patient size and/or use of iterative reconstruction technique. COMPARISON:  01/25/2022 FINDINGS: Brain: No evidence of acute infarction, hemorrhage, hydrocephalus, extra-axial collection or mass lesion/mass effect. Vascular: No hyperdense vessel or unexpected calcification. Skull: Normal. Negative for fracture or focal lesion. Sinuses/Orbits: No acute finding. Other: None. IMPRESSION: 1. No acute intracranial abnormalities. Electronically Signed   By: Signa Kell M.D.   On: 01/25/2022 14:46   DG Chest 2 View  Result Date: 01/25/2022 CLINICAL DATA:  Altered mental status, fatigue EXAM: CHEST - 2 VIEW COMPARISON:  01/24/2022 FINDINGS: Transverse diameter of heart is increased. There is poor inspiration. There are no signs of pulmonary edema or focal pulmonary consolidation. There is no pleural effusion or pneumothorax. Surgical clips are seen in epigastrium. IMPRESSION: Cardiomegaly. There are no signs of pulmonary edema or focal pulmonary consolidation. Electronically Signed   By: Ernie Avena M.D.   On: 01/25/2022 14:35   CT Head Wo Contrast  Result Date: 01/25/2022 CLINICAL DATA:  Headache EXAM: CT HEAD WITHOUT CONTRAST TECHNIQUE: Contiguous axial images were obtained from the base of the skull through the vertex without intravenous contrast. RADIATION DOSE REDUCTION: This exam was performed according to the departmental dose-optimization program which includes automated exposure control, adjustment of the mA and/or kV according to patient size and/or use of iterative reconstruction technique. COMPARISON:  CT paranasal sinuses 09/23/2008 FINDINGS: Brain: No intracranial hemorrhage, mass effect, or evidence of acute infarct. No hydrocephalus. No extra-axial fluid collection. Vascular: No hyperdense vessel or unexpected calcification. Skull: No fracture or  focal lesion. Sinuses/Orbits: No acute finding. Paranasal sinuses and mastoid air cells are well aerated. Other: None. IMPRESSION: No acute intracranial abnormality. Electronically Signed   By: Minerva Fester M.D.   On: 01/25/2022 00:41   DG  Chest Port 1 View  Result Date: 01/24/2022 CLINICAL DATA:  Weight loss, fatigue EXAM: PORTABLE CHEST 1 VIEW COMPARISON:  07/15/2021 FINDINGS: The heart size and mediastinal contours are within normal limits. Both lungs are clear. The visualized skeletal structures are unremarkable. IMPRESSION: No active disease. Electronically Signed   By: Charlett Nose M.D.   On: 01/24/2022 22:02    EKG: Independently reviewed.  Sinus rhythm at 77 bpm.  Nonspecific T wave changes in lead III.  Assessment/Plan Principal Problem:   Hyperammonemic encephalopathy Active Problems:   Heart failure, type unknown (HCC)   Morbid obesity (HCC)   Gastro-esophageal reflux disease without esophagitis   Hyperlipidemia LDL goal <100   History of bariatric surgery   Obstructive sleep apnea (adult) (pediatric)   Hyperammonemic encephalopathy > Patient presenting with ongoing change in mentation for 3 to 4 days with confusion, slurred speech, fatigue and bizarre behavior as per HPI. > Was seen in the ED yesterday more for her fatigue and lightheadedness than anything else and this improved somewhat with IV fluids.  Patient states some of her behaviors improved a little bit but became worse again this morning. > In the ED work-up negative for any infectious etiology, normal ethanol, Tylenol, aspirin, TSH, CT head.  Was noted to have elevated ammonia at 124. > No elevated LFTs to indicate hepatic dysfunction.  Patient does have history of bariatric surgery and that have been rare.  Case reports of bariatric surgery correlation with hyperammonemic encephalopathy. > Patient received lactulose in the ED. - Monitor on telemetry - Continue with 3 times daily lactulose - If fails to respond  consider MRI - Given lack of LFT elevation holding off on liver evaluation for now though likely will benefit from this as there is no liver imaging in the chart that I can see  CHF > Patient undergoing work-up for CHF outpatient with edema and volume overload responding to diuretics. > Currently on Lasix and spironolactone.  Metolazone was stopped recently for concern for overdiuresis. > Echo not yet performed. - Hold Lasix for now considering she remains below her reported heart rate of 319 with most recent weight being 279. - We will continue with spironolactone only for this and hypertension - I's and O's, daily weights - Echocardiogram to further clarify while admitted  History of diabetes diabetes > Had improved after weight loss surgery.  Elevated glucose in the ED. - Check A1c  GERD - Continue home PPI  Obesity History of bariatric surgery > Duodenal switch - Noted  OSA - Continue home CPAP  DVT prophylaxis: Lovenox Code Status:   Full Family Communication:  Updated at bedside Disposition Plan:   Patient is from:  Home  Anticipated DC to:  Home  Anticipated DC date:  1 to 3 days  Anticipated DC barriers: None  Consults called:  None  Admission status:  Observation, telemetry  Severity of Illness: The appropriate patient status for this patient is OBSERVATION. Observation status is judged to be reasonable and necessary in order to provide the required intensity of service to ensure the patient's safety. The patient's presenting symptoms, physical exam findings, and initial radiographic and laboratory data in the context of their medical condition is felt to place them at decreased risk for further clinical deterioration. Furthermore, it is anticipated that the patient will be medically stable for discharge from the hospital within 2 midnights of admission.    Synetta Fail MD [facility]  How to contact the [facility] Attending or Consulting  provider 7A - 7P  or covering provider during after hours 7P -7A, for this patient?   Check the care team in [facility] and look for a) attending/consulting [facility] provider listed and b) the [facility] team listed Log into [email or web address] and use [place]'s universal password to access. If you do not have the password, please contact the hospital operator. Locate the [facility] provider you are looking for under [facility] and page to a number that you can be directly reached. If you still have difficulty reaching the provider, please page the [facility] (Director on Call) for the Hospitalists listed on amion for assistance.  01/25/2022, 10:39 PM

## 2022-01-25 NOTE — ED Provider Notes (Signed)
  Provider Note MRN:  [id number]  Arrival date & time: 01/25/22    ED Course and Medical Decision Making  Assumed care from [facility] at shift change.  See note from prior team for complete details, in brief:   51 yo female Treated for HF with lasix/spironolactone  Feeling tired, dehydrated last few days, few episodes of emesis (none in the last 24 hours), no abdominal pain. Abd soft, nontender, nonperitoneal. Neuro exam is non-focal, no subjective numbness/tingling, speaking clearly and in full sentences She has been tolerating oral intake without difficulty the last 24 hours.  No fevers or chills.  No chest pain or dyspnea.  She had transient lightheadedness which is since resolved after IV fluids here in the ED.  Was also able to eat a full meal while in the ED with contain improvement of her symptoms.  Troponin was negative, this troponin was drawn approximately 4 hours following patient's arrival.  She denies any chest pain in the past 24 hours.  ECG without acute abnormalities.  Suspicion for ACS is very low at this time.  Labs reviewed and are stable.  Chest x-ray and CT head reviewed and are stable  Plan per prior physician CTH, troponin  CT imaging is negative, troponin negative.  Patient feels back to her baseline.  She is hemodynamically stable.  We will plan to discharge patient per plan of prior physician.  Advised patient follow-up with cardiology regarding medication management of her diuretics.  Also encouraged PCP follow-up.  She is scheduled for echocardiogram later on this week.  The patient improved significantly and was discharged in stable condition. Detailed discussions were had with the patient regarding current findings, and need for close f/u with PCP or on call doctor. The patient has been instructed to return immediately if the symptoms worsen in any way for re-evaluation. Patient verbalized understanding and is in agreement with current care plan. All questions  answered prior to discharge.    Procedures  Final Clinical Impressions(s) / ED Diagnoses     ICD-10-CM   1. Other fatigue  R53.83       ED Discharge Orders     None         Discharge Instructions      It was a pleasure caring for you today in the emergency department.  Please return to the emergency department for any worsening or worrisome symptoms.  Please follow-up with your cardiologist and with your primary care doctor Please keep your appointment for echocardiogram later this week         Jeanell Sparrow, DO 01/25/22 [id number]

## 2022-01-25 NOTE — Progress Notes (Signed)
51 year old female presented with worsening fatigue, dyspnea and behavioral changes 65 lb wt loss in a year Hx of CHF on diuretics Noted to have ammonia level elevated to 124 No documented history of liver cirrhosis.  Not on Depakote LFTs normal No known history of liver cirrhosis I do not see any abdominal imagings in chart review  Given lactulose in the ED Accepted to telemetry bed. I ordered for INR stat.

## 2022-01-26 ENCOUNTER — Observation Stay (HOSPITAL_COMMUNITY): Payer: 59

## 2022-01-26 ENCOUNTER — Observation Stay (HOSPITAL_BASED_OUTPATIENT_CLINIC_OR_DEPARTMENT_OTHER): Payer: 59

## 2022-01-26 DIAGNOSIS — I428 Other cardiomyopathies: Secondary | ICD-10-CM

## 2022-01-26 DIAGNOSIS — T59891A Toxic effect of other specified gases, fumes and vapors, accidental (unintentional), initial encounter: Secondary | ICD-10-CM | POA: Diagnosis not present

## 2022-01-26 DIAGNOSIS — Z9049 Acquired absence of other specified parts of digestive tract: Secondary | ICD-10-CM | POA: Diagnosis not present

## 2022-01-26 DIAGNOSIS — G928 Other toxic encephalopathy: Secondary | ICD-10-CM | POA: Diagnosis not present

## 2022-01-26 LAB — ECHOCARDIOGRAM COMPLETE
AR max vel: 2.58 cm2
AV Peak grad: 12 mmHg
Ao pk vel: 1.73 m/s
Area-P 1/2: 2.54 cm2
Height: 65.5 in
S' Lateral: 3.3 cm
Weight: 4409.2 oz

## 2022-01-26 LAB — CBC
HCT: 35.2 % — ABNORMAL LOW (ref 36.0–46.0)
Hemoglobin: 11.6 g/dL — ABNORMAL LOW (ref 12.0–15.0)
MCH: 33 pg (ref 26.0–34.0)
MCHC: 33 g/dL (ref 30.0–36.0)
MCV: 100.3 fL — ABNORMAL HIGH (ref 80.0–100.0)
Platelets: 156 10*3/uL (ref 150–400)
RBC: 3.51 MIL/uL — ABNORMAL LOW (ref 3.87–5.11)
RDW: 11.8 % (ref 11.5–15.5)
WBC: 5.6 10*3/uL (ref 4.0–10.5)
nRBC: 0 % (ref 0.0–0.2)

## 2022-01-26 LAB — COMPREHENSIVE METABOLIC PANEL
ALT: 27 U/L (ref 0–44)
AST: 31 U/L (ref 15–41)
Albumin: 3.6 g/dL (ref 3.5–5.0)
Alkaline Phosphatase: 65 U/L (ref 38–126)
Anion gap: 9 (ref 5–15)
BUN: 18 mg/dL (ref 6–20)
CO2: 22 mmol/L (ref 22–32)
Calcium: 9.3 mg/dL (ref 8.9–10.3)
Chloride: 107 mmol/L (ref 98–111)
Creatinine, Ser: 0.8 mg/dL (ref 0.44–1.00)
GFR, Estimated: >60 mL/min (ref >60)
Glucose, Bld: 125 mg/dL — ABNORMAL HIGH (ref 70–99)
Potassium: 2.9 mmol/L — ABNORMAL LOW (ref 3.5–5.1)
Sodium: 138 mmol/L (ref 135–145)
Total Bilirubin: 1.7 mg/dL — ABNORMAL HIGH (ref 0.3–1.2)
Total Protein: 6.4 g/dL — ABNORMAL LOW (ref 6.5–8.1)

## 2022-01-26 LAB — AMMONIA: Ammonia: 116 umol/L — ABNORMAL HIGH (ref 9–35)

## 2022-01-26 LAB — HIV ANTIBODY (ROUTINE TESTING W REFLEX): HIV Screen 4th Generation wRfx: NONREACTIVE

## 2022-01-26 LAB — MAGNESIUM: Magnesium: 2.2 mg/dL (ref 1.7–2.4)

## 2022-01-26 LAB — POTASSIUM: Potassium: 4.3 mmol/L (ref 3.5–5.1)

## 2022-01-26 MED ORDER — POTASSIUM CHLORIDE CRYS ER 20 MEQ PO TBCR
40.0000 meq | EXTENDED_RELEASE_TABLET | ORAL | Status: AC
  Administered 2022-01-26 (×2): 40 meq via ORAL
  Filled 2022-01-26 (×2): qty 2

## 2022-01-26 MED ORDER — ALUM & MAG HYDROXIDE-SIMETH 200-200-20 MG/5ML PO SUSP
30.0000 mL | ORAL | Status: DC | PRN
  Administered 2022-01-26: 30 mL via ORAL
  Filled 2022-01-26: qty 30

## 2022-01-26 NOTE — Progress Notes (Signed)
Mobility Specialist - Progress Note   01/26/22 1123  Mobility  HOB Elevated/Bed Position Self regulated  Activity Ambulated with assistance in hallway  Range of Motion/Exercises Active  Level of Assistance Standby assist, set-up cues, supervision of patient - no hands on  Assistive Device  (Hallway Rails)  Distance Ambulated (ft) 80 ft  Activity Response Tolerated well  $Mobility charge 1 Mobility   Pt was found in bed and agreeable to ambulate. Pt seemed fatigued during ambulation and upon returning to room used the bathroom. At EOS returned to bed with all necessities in reach.  Ferd Hibbs Mobility Specialist

## 2022-01-26 NOTE — Progress Notes (Signed)
  Transition of Care (TOC) Screening Note   Patient Details  Name: Lynn Andrade Date of Birth: 02/10/1971   Transition of Care [facility]) CM/SW Contact:    Dessa Phi, RN Phone Number: 01/26/2022, 11:45 AM    Transition of Care Department [facility]) has reviewed patient and no TOC needs have been identified at this time. We will continue to monitor patient advancement through interdisciplinary progression rounds. If new patient transition needs arise, please place a TOC consult.

## 2022-01-26 NOTE — Progress Notes (Addendum)
PROGRESS NOTE  Lynn Andrade  OVZ:[id number] DOB: June 19, 1970 DOA: 01/25/2022 PCP: Jettie Pagan, NP   Brief Narrative:  Patient is a 51 year old female with history of newly diagnosed CHF, diabetes type 2, hypertension, obesity, GERD, depression, hyperlipidemia, bariatric surgery, OSA who presented from home with confusion.  She was found to be fatigued, complained of lightheadedness.  Confusion started about 3 to 4 days ago with slurred speech, generalized fatigue, bizarre behavior.  Patient was taking diuretics for congestive heart failure, suspected to be dehydrated on presentation, started on IV fluids.  Clinically since she was hemodynamically stable.  Lab work ammonia level of 124.  TSH normal.  Chest x-ray did not show any pneumonia.  CT head did not show any acute intracranial abnormalities.  Patient was started on lactulose.  Assessment & Plan:  Principal Problem:   Hyperammonemic encephalopathy Active Problems:   Heart failure, type unknown (HCC)   Morbid obesity (HCC)   Gastro-esophageal reflux disease without esophagitis   Hyperlipidemia LDL goal <100   History of bariatric surgery   Obstructive sleep apnea (adult) (pediatric)   Acute metabolic encephalopathy secondary to hyperammonemia: Presented with fatigue, bizarre behavior, confusion since last 3 to 4 days, slurred speech.  Work-up negative for infectious etiology, normal ethanol level, TSH, CT head was negative for any acute intracranial findings.  Ammonia level elevated with unclear reason.  She just drinks a glass of wine a day.  No history of cirrhosis.  LFTs normal.  Patient has history of bariatric surgery.  This could be the etiology? Started on lactulose.  We will get ultrasound of the right upper quadrant. UA not suggestive of UTI.  Urine toxicology negative for drugs. This morning she is completely alert and oriented.  Congestive heart failure: Patient follows with cardiology and is being worked up as an  outpatient.  Recently started on diuretics.  On Lasix and and spironolactone at home.  No recent echocardiogram, echocardiogram pending.  She follows with cardiology  Home lasix on hold.  BNP normal.  Appears euvolemic  GERD: Continue PPI  History of bariatric surgery/obesity: BMI of 45.1.  We recommend follow-up with outpatient bariatric surgery  OSA: Continue home CPAP  Hypokalemia: Being supplemented and monitored.Mg normal          DVT prophylaxis:enoxaparin (LOVENOX) injection 40 mg Start: 01/25/22 2000     Code Status: Full Code  Family Communication: Fiance at bedside  Patient status:Obs  Patient is from : Home  Anticipated discharge to: Home  Estimated DC date: Tomorrow   Consultants: None  Procedures: None  Antimicrobials:  Anti-infectives (From admission, onward)    None       Subjective: Patient seen and examined at bedside this morning.  She looks much better.  Completely alert and oriented.  Denies any new complaints today.  Ammonia is still high.  No abdominal pain.  Abdominal examination benign  Objective: Vitals:   01/25/22 2147 01/26/22 0157 01/26/22 0500 01/26/22 0552  BP: 136/75 137/72  123/70  Pulse: 77 70  70  Resp: 18 20  16   Temp: 98 F (36.7 C) 98 F (36.7 C)  98.7 F (37.1 C)  TempSrc: Oral Oral  Oral  SpO2: 96% 97%  97%  Weight:   125 kg   Height:        Intake/Output Summary (Last 24 hours) at 01/26/2022 0736 Last data filed at 01/25/2022 2150 Gross per 24 hour  Intake 240 ml  Output 100 ml  Net 140 ml  Filed Weights   01/25/22 1249 01/26/22 0500  Weight: 127 kg 125 kg    Examination:  General exam: Overall comfortable, not in distress,obese HEENT: PERRL Respiratory system:  no wheezes or crackles  Cardiovascular system: S1 & S2 heard, RRR.  Gastrointestinal system: Abdomen is nondistended, soft and nontender. Central nervous system: Alert and oriented Extremities: trace lower extremity edema, no clubbing  ,no cyanosis Skin: No rashes, no ulcers,no icterus     Data Reviewed: I have personally reviewed following labs and imaging studies  CBC: Recent Labs  Lab 01/24/22 1641 01/25/22 1303 01/26/22 0507  WBC 5.8 7.1 5.6  NEUTROABS 3.5  --   --   HGB 13.5 13.7 11.6*  HCT 40.8 41.5 35.2*  MCV 99.0 99.0 100.3*  PLT 198 183 786   Basic Metabolic Panel: Recent Labs  Lab 01/24/22 1641 01/24/22 2052 01/25/22 1303 01/26/22 0507  NA 141  --  137 138  K 3.3*  --  3.9 2.9*  CL 105  --  102 107  CO2 25  --  23 22  GLUCOSE 207*  --  152* 125*  BUN 21*  --  20 18  CREATININE 0.96  --  0.93 0.80  CALCIUM 9.9  --  9.8 9.3  MG  --  2.4  --   --      No results found for this or any previous visit (from the past 240 hour(s)).   Radiology Studies: CT Head Wo Contrast  Result Date: 01/25/2022 CLINICAL DATA:  Delirium.  Progressive fatigue. EXAM: CT HEAD WITHOUT CONTRAST TECHNIQUE: Contiguous axial images were obtained from the base of the skull through the vertex without intravenous contrast. RADIATION DOSE REDUCTION: This exam was performed according to the departmental dose-optimization program which includes automated exposure control, adjustment of the mA and/or kV according to patient size and/or use of iterative reconstruction technique. COMPARISON:  01/25/2022 FINDINGS: Brain: No evidence of acute infarction, hemorrhage, hydrocephalus, extra-axial collection or mass lesion/mass effect. Vascular: No hyperdense vessel or unexpected calcification. Skull: Normal. Negative for fracture or focal lesion. Sinuses/Orbits: No acute finding. Other: None. IMPRESSION: 1. No acute intracranial abnormalities. Electronically Signed   By: Kerby Moors M.D.   On: 01/25/2022 14:46   DG Chest 2 View  Result Date: 01/25/2022 CLINICAL DATA:  Altered mental status, fatigue EXAM: CHEST - 2 VIEW COMPARISON:  01/24/2022 FINDINGS: Transverse diameter of heart is increased. There is poor inspiration. There are no  signs of pulmonary edema or focal pulmonary consolidation. There is no pleural effusion or pneumothorax. Surgical clips are seen in epigastrium. IMPRESSION: Cardiomegaly. There are no signs of pulmonary edema or focal pulmonary consolidation. Electronically Signed   By: Elmer Picker M.D.   On: 01/25/2022 14:35   CT Head Wo Contrast  Result Date: 01/25/2022 CLINICAL DATA:  Headache EXAM: CT HEAD WITHOUT CONTRAST TECHNIQUE: Contiguous axial images were obtained from the base of the skull through the vertex without intravenous contrast. RADIATION DOSE REDUCTION: This exam was performed according to the departmental dose-optimization program which includes automated exposure control, adjustment of the mA and/or kV according to patient size and/or use of iterative reconstruction technique. COMPARISON:  CT paranasal sinuses 09/23/2008 FINDINGS: Brain: No intracranial hemorrhage, mass effect, or evidence of acute infarct. No hydrocephalus. No extra-axial fluid collection. Vascular: No hyperdense vessel or unexpected calcification. Skull: No fracture or focal lesion. Sinuses/Orbits: No acute finding. Paranasal sinuses and mastoid air cells are well aerated. Other: None. IMPRESSION: No acute intracranial abnormality. Electronically Signed  By: Minerva Fester M.D.   On: 01/25/2022 00:41   DG Chest Port 1 View  Result Date: 01/24/2022 CLINICAL DATA:  Weight loss, fatigue EXAM: PORTABLE CHEST 1 VIEW COMPARISON:  07/15/2021 FINDINGS: The heart size and mediastinal contours are within normal limits. Both lungs are clear. The visualized skeletal structures are unremarkable. IMPRESSION: No active disease. Electronically Signed   By: Charlett Nose M.D.   On: 01/24/2022 22:02    Scheduled Meds:  enoxaparin (LOVENOX) injection  40 mg Subcutaneous Q24H   lactulose  20 g Oral TID   pantoprazole  40 mg Oral Daily   potassium chloride  40 mEq Oral Q1 Hr x 2   sodium chloride flush  3 mL Intravenous Q12H    spironolactone  25 mg Oral Daily   Continuous Infusions:   LOS: 0 days   Burnadette Pop, MD [facility] P9/26/2023, 7:36 AM

## 2022-01-27 DIAGNOSIS — T59891A Toxic effect of other specified gases, fumes and vapors, accidental (unintentional), initial encounter: Secondary | ICD-10-CM | POA: Diagnosis not present

## 2022-01-27 DIAGNOSIS — G928 Other toxic encephalopathy: Secondary | ICD-10-CM | POA: Diagnosis not present

## 2022-01-27 LAB — BASIC METABOLIC PANEL
Anion gap: 15 (ref 5–15)
BUN: 91 mg/dL — ABNORMAL HIGH (ref 6–20)
CO2: 16 mmol/L — ABNORMAL LOW (ref 22–32)
Calcium: 7.2 mg/dL — ABNORMAL LOW (ref 8.9–10.3)
Chloride: 101 mmol/L (ref 98–111)
Creatinine, Ser: 8.14 mg/dL — ABNORMAL HIGH (ref 0.44–1.00)
GFR, Estimated: 6 mL/min — ABNORMAL LOW (ref >60)
Glucose, Bld: 318 mg/dL — ABNORMAL HIGH (ref 70–99)
Potassium: 3.8 mmol/L (ref 3.5–5.1)
Sodium: 132 mmol/L — ABNORMAL LOW (ref 135–145)

## 2022-01-27 LAB — COMPREHENSIVE METABOLIC PANEL
ALT: 25 U/L (ref 0–44)
AST: 29 U/L (ref 15–41)
Albumin: 3.3 g/dL — ABNORMAL LOW (ref 3.5–5.0)
Alkaline Phosphatase: 60 U/L (ref 38–126)
Anion gap: 4 — ABNORMAL LOW (ref 5–15)
BUN: 17 mg/dL (ref 6–20)
CO2: 26 mmol/L (ref 22–32)
Calcium: 8.7 mg/dL — ABNORMAL LOW (ref 8.9–10.3)
Chloride: 109 mmol/L (ref 98–111)
Creatinine, Ser: 0.9 mg/dL (ref 0.44–1.00)
GFR, Estimated: >60 mL/min (ref >60)
Glucose, Bld: 101 mg/dL — ABNORMAL HIGH (ref 70–99)
Potassium: 3.9 mmol/L (ref 3.5–5.1)
Sodium: 139 mmol/L (ref 135–145)
Total Bilirubin: 1.3 mg/dL — ABNORMAL HIGH (ref 0.3–1.2)
Total Protein: 5.8 g/dL — ABNORMAL LOW (ref 6.5–8.1)

## 2022-01-27 LAB — AMMONIA: Ammonia: 21 umol/L (ref 9–35)

## 2022-01-27 MED ORDER — LACTULOSE 10 GM/15ML PO SOLN: 20.0000 g | Freq: Two times a day (BID) | ORAL | 1 refills | Status: DC

## 2022-01-27 MED ORDER — ENOXAPARIN SODIUM 60 MG/0.6ML IJ SOSY: 60.0000 mg | PREFILLED_SYRINGE | INTRAMUSCULAR | Status: DC

## 2022-01-27 NOTE — Plan of Care (Signed)
  Problem: Education: Goal: Knowledge of General Education information will improve Description: Including pain rating scale, medication(s)/side effects and non-pharmacologic comfort measures Outcome: Progressing   Problem: Health Behavior/Discharge Planning: Goal: Ability to manage health-related needs will improve Outcome: Progressing   Problem: Clinical Measurements: Goal: Ability to maintain clinical measurements within normal limits will improve Outcome: Progressing Goal: Respiratory complications will improve Outcome: Progressing   Problem: Activity: Goal: Risk for activity intolerance will decrease Outcome: Progressing   Problem: Pain Managment: Goal: General experience of comfort will improve Outcome: Progressing   Problem: Skin Integrity: Goal: Risk for impaired skin integrity will decrease Outcome: Progressing   

## 2022-01-27 NOTE — Telephone Encounter (Signed)
-----   Message from Werner Lean, MD sent at 01/14/2022  8:23 AM EDT ----- Testing Results WNL.  No change to diagnostic or therapeutic plan. NA is a bit high but this may come down with high dose diuretics.  Creatinine may go up as we get her fluid off.  No additional medicaitions at this time. Needs BMP and MG in a week or so with [place] or PCP

## 2022-01-27 NOTE — Telephone Encounter (Signed)
Will route to MD to review.

## 2022-01-27 NOTE — Telephone Encounter (Signed)
Spoke with pt reports has been inpatient d/t elevated ammonia levels.  Is now taking Lactulose and levels have decreased.  Feels more with it today but is extremely tired. Also reports has lost 41 lbs since 01/13/22 OV with Dr. Gasper Sells.   Had an Echo while in the hospital; Echo scheduled for 01/28/22 canceled.  Pt wants to know if needs to be seen sooner than 02/25/22.

## 2022-01-27 NOTE — Discharge Summary (Signed)
Physician Discharge Summary  Lynn Andrade WUJ:[id number] DOB: 14-Jul-1970 DOA: 01/25/2022  PCP: Tomasa Hose, NP  Admit date: 01/25/2022 Discharge date: 01/27/2022  Admitted From: Home Disposition:  Home  Discharge Condition:Stable CODE STATUS:FULL Diet recommendation: Heart Healthy    Brief/Interim Summary:  Patient is a 51 year old female with history of newly diagnosed CHF, diabetes type 2, hypertension, obesity, GERD, depression, hyperlipidemia, bariatric surgery, OSA who presented from home with confusion.  She was found to be fatigued, complained of lightheadedness.  Confusion started about 3 to 4 days ago with slurred speech, generalized fatigue, bizarre behavior.  Patient was taking diuretics for congestive heart failure, suspected to be dehydrated on presentation, started on IV fluids.  Clinically since she was hemodynamically stable.  Lab work ammonia level of 124.  TSH normal.  Chest x-ray did not show any pneumonia.  CT head did not show any acute intracranial abnormalities.  Patient was started on lactulose with significant improvement in the liver function.  Ultrasound of the abdomen showed possible hepatic steatosis or fibrosis, likely NASH which was the most likely reason for high ammonia level. Patient is medically stable for discharge home today.  Following problems were addressed during her hospitalization:     Acute metabolic encephalopathy secondary to hyperammonemia: Presented with fatigue, bizarre behavior, confusion since 3 to 4 days, slurred speech.  Work-up negative for infectious etiology, normal ethanol level, TSH, CT head was negative for any acute intracranial findings. UA not suggestive of UTI.  Urine toxicology negative for drugs.  Ammonia level elevated .  She just drinks a glass of wine a day.  No history of cirrhosis.  LFTs normal.  Patient has history of bariatric surgery.  Started on lactulose.  Ammonia level normalized.  She is completely alert and  oriented.  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  The the most likely reason for high ammonia level.  Continue lactulose at home.  Follow-up with hepatology as an outpatient   Diastolic congestive heart failure: Patient follows with cardiology and is being worked up as an outpatient.  Recently started on diuretics.  On Lasix and and spironolactone at home.BNP normal.  Appears euvolemic. Echo showed EF of 60 to 95%, grade 1 diastolic dysfunction.  We will continue diuretics at home.  We recommend to follow-up with cardiology as an outpatient   GERD: Continue PPI   History of bariatric surgery/obesity: BMI of 45.1.  We recommend follow-up with outpatient bariatric surgery   OSA: Continue home CPAP   Hypokalemia: Supplemented and corrected  Discharge Diagnoses:  Principal Problem:   Hyperammonemic encephalopathy Active Problems:   Heart failure, type unknown ([place])   Morbid obesity ([place])   Gastro-esophageal reflux disease without esophagitis   Hyperlipidemia LDL goal <100   History of bariatric surgery   Obstructive sleep apnea (adult) (pediatric)    Discharge Instructions  Discharge Instructions     Diet - low sodium heart healthy   Complete by: As directed    Discharge instructions   Complete by: As directed    1)Please take prescribed medications as instructed 2)Follow up with your PCP in week.Do a BMP and ammonia level checked during the follow-up 3)Follow up with the gastroenterologist for the referral of her PCP to monitor your liver function 4)Follow up with your cardiologist. 5)Stop alcohol intake   Increase activity slowly   Complete by: As directed       Allergies as of 01/27/2022       Reactions  Aspirin-caffeine Hives, Swelling   Etodolac Hives   Shrimp (diagnostic) Hives   Toradol [ketorolac Tromethamine]    unknown        Medication List     STOP taking these medications     metolazone 5 MG tablet Commonly known as: ZAROXOLYN       TAKE these medications    Biotin 10 MG Caps Take 1 capsule by mouth daily.   cyanocobalamin 1000 MCG tablet Take 1,000 mcg by mouth daily.   FeroSul 325 (65 FE) MG tablet Generic drug: ferrous sulfate Take 325 mg by mouth 2 (two) times daily.   furosemide 40 MG tablet Commonly known as: LASIX Take 40 mg by mouth 2 (two) times daily.   lactulose 10 GM/15ML solution Commonly known as: CHRONULAC Take 30 mLs (20 g total) by mouth 2 (two) times daily.   levocetirizine 5 MG tablet Commonly known as: XYZAL Take 5 mg by mouth every evening.   Nasacort Allergy 24HR 55 MCG/ACT Aero nasal inhaler Generic drug: triamcinolone Place 2 sprays into the nose daily.   omeprazole 10 MG capsule Commonly known as: PRILOSEC Take 10 mg by mouth daily.   spironolactone 25 MG tablet Commonly known as: Aldactone Take 1 tablet (25 mg total) by mouth daily.   vitamin A 3 MG (10000 UNITS) capsule Take 10,000 Units by mouth daily.        Follow-up Information     Jettie Pagan, NP. Schedule an appointment as soon as possible for a visit in 1 week(s).   Specialty: Nurse Practitioner Contact information: [address] [place] [address] [phone]                Allergies  Allergen Reactions   Aspirin-Caffeine Hives and Swelling   Etodolac Hives   Shrimp (Diagnostic) Hives   Toradol [Ketorolac Tromethamine]     unknown    Consultations: None   Procedures/Studies: US Abdomen Limited RUQ (LIVER/GB)  Result Date: 01/26/2022 CLINICAL DATA:  Altered mental status. EXAM: ULTRASOUND ABDOMEN LIMITED RIGHT UPPER QUADRANT COMPARISON:  None Available. FINDINGS: Gallbladder: Post cholecystectomy. Common bile duct: Diameter: 3.1 mm Liver: No focal lesion identified. Coarse diffusely increased parenchymal echogenicity. Portal vein is patent on color Doppler imaging with normal direction of blood flow  towards the liver. Other: None. IMPRESSION: Coarse diffusely increased parenchymal echogenicity of the liver which may be seen with hepatic steatosis or fibrosis. Post cholecystectomy without complicating features. Electronically Signed   By: Ted Mcalpine M.D.   On: 01/26/2022 15:50   ECHOCARDIOGRAM COMPLETE  Result Date: 01/26/2022    ECHOCARDIOGRAM REPORT   Patient Name:   Lynn Andrade Date of Exam: 01/26/2022 Medical Rec #:  [id number]      Height:       65.5 in Accession #:    [id number]     Weight:       275.6 lb Date of Birth:  [birth year]      BSA:          2.280 m Patient Age:    51 years       BP:           123/70 mmHg Patient Gender: F              HR:           69 bpm. Exam Location:  Inpatient Procedure: 2D Echo, Cardiac Doppler and Color Doppler Indications:    Cardiomyopathy  History:        Patient  has no prior history of Echocardiogram examinations.                 Risk Factors:Hypertension and Diabetes.  Sonographer:    Eduard Roux Referring Phys: [id number] Cecille Po MELVIN IMPRESSIONS  1. Left ventricular ejection fraction, by estimation, is 60 to 65%. The left ventricle has normal function. The left ventricle has no regional wall motion abnormalities. The left ventricular internal cavity size was moderately dilated. Left ventricular diastolic parameters are consistent with Grade I diastolic dysfunction (impaired relaxation).  2. Right ventricular systolic function is normal. The right ventricular size is normal.  3. The mitral valve is normal in structure. No evidence of mitral valve regurgitation. No evidence of mitral stenosis.  4. The aortic valve was not well visualized. Aortic valve regurgitation is mild. Comparison(s): No prior Echocardiogram. FINDINGS  Left Ventricle: Left ventricular ejection fraction, by estimation, is 60 to 65%. The left ventricle has normal function. The left ventricle has no regional wall motion abnormalities. The left ventricular internal cavity size  was moderately dilated. There is no left ventricular hypertrophy. Left ventricular diastolic parameters are consistent with Grade I diastolic dysfunction (impaired relaxation). Right Ventricle: The right ventricular size is normal. No increase in right ventricular wall thickness. Right ventricular systolic function is normal. Left Atrium: Left atrial size was normal in size. Right Atrium: Right atrial size was normal in size. Pericardium: There is no evidence of pericardial effusion. Mitral Valve: The mitral valve is normal in structure. No evidence of mitral valve regurgitation. No evidence of mitral valve stenosis. Tricuspid Valve: The tricuspid valve is normal in structure. Tricuspid valve regurgitation is not demonstrated. No evidence of tricuspid stenosis. Aortic Valve: The aortic valve was not well visualized. Aortic valve regurgitation is mild. Aortic valve peak gradient measures 12.0 mmHg. Pulmonic Valve: The pulmonic valve was normal in structure. Pulmonic valve regurgitation is not visualized. No evidence of pulmonic stenosis. Aorta: The aortic root and ascending aorta are structurally normal, with no evidence of dilitation. IAS/Shunts: No atrial level shunt detected by color flow Doppler.  LEFT VENTRICLE PLAX 2D LVIDd:         5.75 cm   Diastology LVIDs:         3.30 cm   LV e' medial:    7.13 cm/s LV PW:         0.95 cm   LV E/e' medial:  12.1 LV IVS:        0.90 cm   LV e' lateral:   6.74 cm/s LVOT diam:     2.10 cm   LV E/e' lateral: 12.8 LV SV:         86 LV SV Index:   38 LVOT Area:     3.46 cm  RIGHT VENTRICLE RV Basal diam:  3.00 cm RV S prime:     17.40 cm/s TAPSE (M-mode): 2.5 cm LEFT ATRIUM             Index        RIGHT ATRIUM           Index LA diam:        4.40 cm 1.93 cm/m   RA Area:     16.10 cm LA Vol (A2C):   71.6 ml 31.41 ml/m  RA Volume:   38.40 ml  16.84 ml/m LA Vol (A4C):   63.2 ml 27.72 ml/m LA Biplane Vol: 69.3 ml 30.40 ml/m  AORTIC VALVE  PULMONIC VALVE AV  Area (Vmax): 2.58 cm     PV Vmax:       1.15 m/s AV Vmax:        173.00 cm/s  PV Peak grad:  5.3 mmHg AV Peak Grad:   12.0 mmHg LVOT Vmax:      129.00 cm/s LVOT Vmean:     81.200 cm/s LVOT VTI:       0.249 m  AORTA Ao Root diam: 3.20 cm Ao Asc diam:  3.20 cm MITRAL VALVE MV Area (PHT): 2.54 cm    SHUNTS MV Decel Time: 299 msec    Systemic VTI:  0.25 m MV E velocity: 86.60 cm/s  Systemic Diam: 2.10 cm MV A velocity: 87.30 cm/s MV E/A ratio:  0.99 Riley Lam MD Electronically signed by Riley Lam MD Signature Date/Time: 01/26/2022/3:11:43 PM    Final    CT Head Wo Contrast  Result Date: 01/25/2022 CLINICAL DATA:  Delirium.  Progressive fatigue. EXAM: CT HEAD WITHOUT CONTRAST TECHNIQUE: Contiguous axial images were obtained from the base of the skull through the vertex without intravenous contrast. RADIATION DOSE REDUCTION: This exam was performed according to the departmental dose-optimization program which includes automated exposure control, adjustment of the mA and/or kV according to patient size and/or use of iterative reconstruction technique. COMPARISON:  01/25/2022 FINDINGS: Brain: No evidence of acute infarction, hemorrhage, hydrocephalus, extra-axial collection or mass lesion/mass effect. Vascular: No hyperdense vessel or unexpected calcification. Skull: Normal. Negative for fracture or focal lesion. Sinuses/Orbits: No acute finding. Other: None. IMPRESSION: 1. No acute intracranial abnormalities. Electronically Signed   By: Signa Kell M.D.   On: 01/25/2022 14:46   DG Chest 2 View  Result Date: 01/25/2022 CLINICAL DATA:  Altered mental status, fatigue EXAM: CHEST - 2 VIEW COMPARISON:  01/24/2022 FINDINGS: Transverse diameter of heart is increased. There is poor inspiration. There are no signs of pulmonary edema or focal pulmonary consolidation. There is no pleural effusion or pneumothorax. Surgical clips are seen in epigastrium. IMPRESSION: Cardiomegaly. There are no signs of  pulmonary edema or focal pulmonary consolidation. Electronically Signed   By: Ernie Avena M.D.   On: 01/25/2022 14:35   CT Head Wo Contrast  Result Date: 01/25/2022 CLINICAL DATA:  Headache EXAM: CT HEAD WITHOUT CONTRAST TECHNIQUE: Contiguous axial images were obtained from the base of the skull through the vertex without intravenous contrast. RADIATION DOSE REDUCTION: This exam was performed according to the departmental dose-optimization program which includes automated exposure control, adjustment of the mA and/or kV according to patient size and/or use of iterative reconstruction technique. COMPARISON:  CT paranasal sinuses 09/23/2008 FINDINGS: Brain: No intracranial hemorrhage, mass effect, or evidence of acute infarct. No hydrocephalus. No extra-axial fluid collection. Vascular: No hyperdense vessel or unexpected calcification. Skull: No fracture or focal lesion. Sinuses/Orbits: No acute finding. Paranasal sinuses and mastoid air cells are well aerated. Other: None. IMPRESSION: No acute intracranial abnormality. Electronically Signed   By: Minerva Fester M.D.   On: 01/25/2022 00:41   DG Chest Port 1 View  Result Date: 01/24/2022 CLINICAL DATA:  Weight loss, fatigue EXAM: PORTABLE CHEST 1 VIEW COMPARISON:  07/15/2021 FINDINGS: The heart size and mediastinal contours are within normal limits. Both lungs are clear. The visualized skeletal structures are unremarkable. IMPRESSION: No active disease. Electronically Signed   By: Charlett Nose M.D.   On: 01/24/2022 22:02   US Venous Img Lower Unilateral Right (DVT)  Result Date: 01/12/2022 CLINICAL DATA:  Right lower extremity pain and edema for the  past 3 and half weeks. Evaluate for DVT. EXAM: RIGHT LOWER EXTREMITY VENOUS DOPPLER ULTRASOUND TECHNIQUE: Gray-scale sonography with graded compression, as well as color Doppler and duplex ultrasound were performed to evaluate the lower extremity deep venous systems from the level of the common femoral  vein and including the common femoral, femoral, profunda femoral, popliteal and calf veins including the posterior tibial, peroneal and gastrocnemius veins when visible. The superficial great saphenous vein was also interrogated. Spectral Doppler was utilized to evaluate flow at rest and with distal augmentation maneuvers in the common femoral, femoral and popliteal veins. COMPARISON:  Right lower extremity venous Doppler ultrasound-11/30/2020 (negative); 10/30/2008 (negative) FINDINGS: Examination is degraded due to patient body habitus and poor sonographic window. Contralateral Common Femoral Vein: Respiratory phasicity is normal and symmetric with the symptomatic side. No evidence of thrombus. Normal compressibility. Common Femoral Vein: No evidence of thrombus. Normal compressibility, respiratory phasicity and response to augmentation. Saphenofemoral Junction: No evidence of thrombus. Normal compressibility and flow on color Doppler imaging. Profunda Femoral Vein: No evidence of thrombus. Normal compressibility and flow on color Doppler imaging. Femoral Vein: No evidence of thrombus. Normal compressibility, respiratory phasicity and response to augmentation. Popliteal Vein: No evidence of thrombus. Normal compressibility, respiratory phasicity and response to augmentation. Calf Veins: No evidence of thrombus. Normal compressibility and flow on color Doppler imaging. Superficial Great Saphenous Vein: No evidence of thrombus. Normal compressibility. Other Findings: Note is made of a mildly prominent though non pathologically enlarged right inguinal lymph node which is not enlarged by size criteria measuring 1 cm in greatest short axis diameter and maintains a benign fatty hila, presumably reactive in etiology. IMPRESSION: No evidence of DVT within the right lower extremity. Electronically Signed   By: Simonne Come M.D.   On: 01/12/2022 14:23      Subjective: Patient seen and examined at bedside today.   Hemodynamically stable.  Medically stable for discharge today.  Discharge Exam: Vitals:   01/26/22 2239 01/27/22 0654  BP: 112/62 110/65  Pulse: 65 65  Resp: (!) 22 18  Temp: 98.1 F (36.7 C) 98.8 F (37.1 C)  SpO2: 99% 97%   Vitals:   01/26/22 1153 01/26/22 2239 01/27/22 0527 01/27/22 0654  BP: 123/70 112/62  110/65  Pulse: 73 65  65  Resp: 17 (!) 22  18  Temp: 98.8 F (37.1 C) 98.1 F (36.7 C)  98.8 F (37.1 C)  TempSrc: Oral Oral  Oral  SpO2: 97% 99%  97%  Weight:   134.7 kg   Height:        General: Pt is alert, awake, not in acute distress Cardiovascular: RRR, S1/S2 +, no rubs, no gallops Respiratory: CTA bilaterally, no wheezing, no rhonchi Abdominal: Soft, NT, ND, bowel sounds + Extremities: no edema, no cyanosis    The results of significant diagnostics from this hospitalization (including imaging, microbiology, ancillary and laboratory) are listed below for reference.     Microbiology: No results found for this or any previous visit (from the past 240 hour(s)).   Labs: BNP (last 3 results) Recent Labs    01/24/22 2052 01/25/22 1303  BNP 23.7 25.5   Basic Metabolic Panel: Recent Labs  Lab 01/24/22 1641 01/24/22 2052 01/25/22 1303 01/26/22 0507 01/26/22 0857 01/26/22 1452 01/27/22 0515 01/27/22 0758  NA 141  --  137 138  --   --  132* 139  K 3.3*  --  3.9 2.9*  --  4.3 3.8 3.9  CL 105  --  102  107  --   --  101 109  CO2 25  --  23 22  --   --  16* 26  GLUCOSE 207*  --  152* 125*  --   --  318* 101*  BUN 21*  --  20 18  --   --  91* 17  CREATININE 0.96  --  0.93 0.80  --   --  8.14* 0.90  CALCIUM 9.9  --  9.8 9.3  --   --  7.2* 8.7*  MG  --  2.4  --   --  2.2  --   --   --    Liver Function Tests: Recent Labs  Lab 01/24/22 1641 01/25/22 1342 01/26/22 0507 01/27/22 0758  AST 32 27 31 29   ALT 24 26 27 25   ALKPHOS 77 62 65 60  BILITOT 1.1 1.3* 1.7* 1.3*  PROT 7.3 7.0 6.4* 5.8*  ALBUMIN 4.4 4.2 3.6 3.3*   No results for input(s):  "LIPASE", "AMYLASE" in the last 168 hours. Recent Labs  Lab 01/25/22 1342 01/26/22 0857 01/27/22 0515  AMMONIA 124* 116* 21   CBC: Recent Labs  Lab 01/24/22 1641 01/25/22 1303 01/26/22 0507  WBC 5.8 7.1 5.6  NEUTROABS 3.5  --   --   HGB 13.5 13.7 11.6*  HCT 40.8 41.5 35.2*  MCV 99.0 99.0 100.3*  PLT 198 183 156   Cardiac Enzymes: No results for input(s): "CKTOTAL", "CKMB", "CKMBINDEX", "TROPONINI" in the last 168 hours. BNP: Invalid input(s): "POCBNP" CBG: Recent Labs  Lab 01/24/22 2153 01/25/22 1257  GLUCAP 190* 138*   D-Dimer No results for input(s): "DDIMER" in the last 72 hours. Hgb A1c No results for input(s): "HGBA1C" in the last 72 hours. Lipid Profile No results for input(s): "CHOL", "HDL", "LDLCALC", "TRIG", "CHOLHDL", "LDLDIRECT" in the last 72 hours. Thyroid function studies Recent Labs    01/25/22 1342  TSH 1.943   Anemia work up No results for input(s): "VITAMINB12", "FOLATE", "FERRITIN", "TIBC", "IRON", "RETICCTPCT" in the last 72 hours. Urinalysis    Component Value Date/Time   COLORURINE YELLOW 01/25/2022 1633   APPEARANCEUR HAZY (A) 01/25/2022 1633   LABSPEC 1.034 (H) 01/25/2022 1633   PHURINE 5.5 01/25/2022 1633   GLUCOSEU NEGATIVE 01/25/2022 1633   HGBUR NEGATIVE 01/25/2022 1633   BILIRUBINUR SMALL (A) 01/25/2022 1633   BILIRUBINUR small (A) 11/10/2021 1716   KETONESUR NEGATIVE 01/25/2022 1633   PROTEINUR 30 (A) 01/25/2022 1633   UROBILINOGEN 2.0 (A) 11/10/2021 1716   NITRITE NEGATIVE 01/25/2022 1633   LEUKOCYTESUR TRACE (A) 01/25/2022 1633   Sepsis Labs Recent Labs  Lab 01/24/22 1641 01/25/22 1303 01/26/22 0507  WBC 5.8 7.1 5.6   Microbiology No results found for this or any previous visit (from the past 240 hour(s)).  Please note: You were cared for by a hospitalist during your hospital stay. Once you are discharged, your primary care physician will handle any further medical issues. Please note that NO REFILLS for any  discharge medications will be authorized once you are discharged, as it is imperative that you return to your primary care physician (or establish a relationship with a primary care physician if you do not have one) for your post hospital discharge needs so that they can reassess your need for medications and monitor your lab values.    Time coordinating discharge: 40 minutes  SIGNED:   01/27/22, MD  [facility] 01/27/2022, 11:48 AM Pager [place]  If 7PM-7AM, please contact night-coverage [email or web address] Password  [email or web address]

## 2022-01-28 ENCOUNTER — Other Ambulatory Visit (HOSPITAL_COMMUNITY): Payer: 59

## 2022-02-01 ENCOUNTER — Encounter: Payer: Self-pay | Admitting: Gastroenterology

## 2022-02-01 ENCOUNTER — Ambulatory Visit: Payer: 59 | Admitting: Nurse Practitioner

## 2022-02-01 DIAGNOSIS — G928 Other toxic encephalopathy: Secondary | ICD-10-CM | POA: Diagnosis not present

## 2022-02-01 DIAGNOSIS — T59891A Toxic effect of other specified gases, fumes and vapors, accidental (unintentional), initial encounter: Secondary | ICD-10-CM | POA: Diagnosis not present

## 2022-02-01 DIAGNOSIS — K7581 Nonalcoholic steatohepatitis (NASH): Secondary | ICD-10-CM | POA: Diagnosis not present

## 2022-02-01 DIAGNOSIS — K912 Postsurgical malabsorption, not elsewhere classified: Secondary | ICD-10-CM | POA: Diagnosis not present

## 2022-02-01 DIAGNOSIS — E559 Vitamin D deficiency, unspecified: Secondary | ICD-10-CM | POA: Diagnosis not present

## 2022-02-01 DIAGNOSIS — Z862 Personal history of diseases of the blood and blood-forming organs and certain disorders involving the immune mechanism: Secondary | ICD-10-CM | POA: Diagnosis not present

## 2022-02-01 DIAGNOSIS — D508 Other iron deficiency anemias: Secondary | ICD-10-CM | POA: Diagnosis not present

## 2022-02-01 DIAGNOSIS — I5032 Chronic diastolic (congestive) heart failure: Secondary | ICD-10-CM | POA: Diagnosis not present

## 2022-02-08 ENCOUNTER — Other Ambulatory Visit (HOSPITAL_COMMUNITY)
Admission: RE | Admit: 2022-02-08 | Discharge: 2022-02-08 | Disposition: A | Discharge status: Home / Self Care | Payer: 59 | Source: Ambulatory Visit | Attending: Nurse Practitioner | Admitting: Nurse Practitioner

## 2022-02-08 ENCOUNTER — Ambulatory Visit (INDEPENDENT_AMBULATORY_CARE_PROVIDER_SITE_OTHER): Payer: 59 | Admitting: Nurse Practitioner

## 2022-02-08 ENCOUNTER — Encounter: Payer: Self-pay | Admitting: Nurse Practitioner

## 2022-02-08 VITALS — BP 112/72 | HR 80

## 2022-02-08 DIAGNOSIS — Z124 Encounter for screening for malignant neoplasm of cervix: Secondary | ICD-10-CM | POA: Insufficient documentation

## 2022-02-08 NOTE — Progress Notes (Signed)
   Acute Office Visit  Subjective:    Patient ID: Lynn Andrade, female    DOB: Sep 01, 1970, 51 y.o.   MRN: [id number]   HPI 51 y.o. presents today for repeat pap. Pap 11/09/21 was unsatisfactory for cytology, HPV negative. Recently hospitalized for elevated ammonia levels, dong better but having some dizzy spells, had follow up at PCP office this morning.    Review of Systems  Constitutional: Negative.   Genitourinary: Negative.        Objective:    Physical Exam Constitutional:      Appearance: Normal appearance.  Genitourinary:    General: Normal vulva.     Vagina: Normal.     Cervix: Normal.     BP 112/72   Pulse 80   SpO2 97%  Wt Readings from Last 3 Encounters:  01/27/22 297 lb (134.7 kg)  01/24/22 280 lb 3.2 oz (127.1 kg)  01/13/22 (!) 338 lb 12.8 oz (153.7 kg)        Patient informed chaperone available to be present for breast and/or pelvic exam. Patient has requested no chaperone to be present. Patient has been advised what will be completed during breast and pelvic exam.   Assessment & Plan:   Problem List Items Addressed This Visit   None Visit Diagnoses     Encounter for Papanicolaou smear for cervical cancer screening    -  Primary   Relevant Orders   Cytology - PAP( [place])      Plan: Pap with cytology only today.      Tamela Gammon DNP, 11:48 AM 02/08/2022

## 2022-02-15 ENCOUNTER — Other Ambulatory Visit: Payer: Self-pay | Admitting: Nurse Practitioner

## 2022-02-15 DIAGNOSIS — N760 Acute vaginitis: Secondary | ICD-10-CM

## 2022-02-15 LAB — CYTOLOGY - PAP
Adequacy: ABSENT
Diagnosis: NEGATIVE
Diagnosis: REACTIVE

## 2022-02-15 MED ORDER — METRONIDAZOLE 500 MG PO TABS: 500.0000 mg | ORAL_TABLET | Freq: Two times a day (BID) | ORAL | 0 refills | Status: DC

## 2022-02-25 ENCOUNTER — Encounter: Payer: Self-pay | Admitting: Internal Medicine

## 2022-02-25 ENCOUNTER — Ambulatory Visit: Payer: 59 | Attending: Internal Medicine | Admitting: Internal Medicine

## 2022-02-25 VITALS — BP 116/58 | HR 77 | Ht 65.5 in | Wt 312.0 lb

## 2022-02-25 DIAGNOSIS — G928 Other toxic encephalopathy: Secondary | ICD-10-CM | POA: Diagnosis not present

## 2022-02-25 DIAGNOSIS — T59891A Toxic effect of other specified gases, fumes and vapors, accidental (unintentional), initial encounter: Secondary | ICD-10-CM | POA: Diagnosis not present

## 2022-02-25 DIAGNOSIS — I5032 Chronic diastolic (congestive) heart failure: Secondary | ICD-10-CM | POA: Diagnosis not present

## 2022-02-25 DIAGNOSIS — E785 Hyperlipidemia, unspecified: Secondary | ICD-10-CM | POA: Diagnosis not present

## 2022-02-25 DIAGNOSIS — Z9884 Bariatric surgery status: Secondary | ICD-10-CM | POA: Diagnosis not present

## 2022-02-25 MED ORDER — DAPAGLIFLOZIN PROPANEDIOL 10 MG PO TABS: 10.0000 mg | ORAL_TABLET | Freq: Every day | ORAL | 0 refills | Status: DC

## 2022-02-25 MED ORDER — SPIRONOLACTONE 25 MG PO TABS: 25.0000 mg | ORAL_TABLET | Freq: Every day | ORAL | 3 refills | Status: DC

## 2022-02-25 MED ORDER — DAPAGLIFLOZIN PROPANEDIOL 10 MG PO TABS: 10.0000 mg | ORAL_TABLET | Freq: Every day | ORAL | 3 refills | Status: DC

## 2022-02-25 NOTE — Patient Instructions (Signed)
Medication Instructions:  Your physician has recommended you make the following change in your medication:  START: dapagliflozin propanediol (Farxiga) 10 mg by mouth once daily before breakfast We have provided you with 30 days of samples, a 30 day free trial offer, and co pay card   REFILLED: spironolactone (Aldactone) 25 mg  *If you need a refill on your cardiac medications before your next appointment, please call your pharmacy*   Lab Work: IN 1-2 WEEKS: BMP If you have labs (blood work) drawn today and your tests are completely normal, you will receive your results only by: [place] (if you have MyChart) OR A paper copy in the mail If you have any lab test that is abnormal or we need to change your treatment, we will call you to review the results.   Testing/Procedures: NONE   Follow-Up: At [facility] you and your health needs are our priority.  As part of our continuing mission to provide you with exceptional heart care, we have created designated Provider Care Teams.  These Care Teams include your primary Cardiologist (physician) and Advanced Practice Providers (APPs -  Physician Assistants and Nurse Practitioners) who all work together to provide you with the care you need, when you need it.   Your next appointment:   6 month(s)  The format for your next appointment:   In Person  Provider:   Werner Lean, MD      Important Information About Sugar

## 2022-02-25 NOTE — Addendum Note (Signed)
Addended by: Precious Gilding on: 02/25/2022 11:22 AM   Modules accepted: Orders

## 2022-02-25 NOTE — Progress Notes (Signed)
Cardiology Office Note:    Date:  02/25/2022   ID:  Lynn Andrade, DOB Jun 15, 1970, MRN [id number]  PCP:  Lynn Hose, NP   [facility] Cardiologist:  Lynn Lean, MD     Referring MD: Lynn Hose, NP   CC: LE swelling  History of Present Illness:    Lynn Andrade is a 51 y.o. female with a hx of HTN, DM, who presents for evaluation.  Hx of morbid obesity with prior weight loss surgery (duodenal switch). Went from 450- 255; during [place] to 319.   2023: Had ED visit for hepatic encephalopathy.  During this had mild diastolic dysfunction on echo.  Patient notes that she is doing much better since her hospitalization.   No long has pitting edema. Lost 70 pounds on metolazone.  Had hyperammonemia. Now runs with her children.  No chest pain or pressure .  No SOB/DOE and no PND/Orthopnea.  No weight gain or leg swelling.  No palpitations or syncope.   Past Medical History:  Diagnosis Date   Acid reflux    Adjustment disorder with depressed mood 10/27/2009   Formatting of this note might be different from the original. Overview:  Adjustment Disorder With Depressed Mood   Diabetes mellitus with coincident hypertension ([place]) 01/13/2022   Diabetes mellitus without complication ([place])    Edema    Essential hypertension 08/31/2010   Formatting of this note might be different from the original. Hypertension Formatting of this note might be different from the original. Overview:  Hypertension   Fluid retention    Hypertension     Past Surgical History:  Procedure Laterality Date   BILIOPANCREATIC DIVISION W DUODENAL SWITCH     CHOLECYSTECTOMY     DILATION AND CURETTAGE OF UTERUS     TONSILLECTOMY      Current Medications: Current Meds  Medication Sig   Biotin 10 MG CAPS Take 1 capsule by mouth daily.   Cholecalciferol (VITAMIN D3) 125 MCG (5000 UT) CAPS Take by mouth daily.   cyanocobalamin 1000 MCG tablet Take 1,000 mcg by mouth  daily.   dapagliflozin propanediol (FARXIGA) 10 MG TABS tablet Take 1 tablet (10 mg total) by mouth daily before breakfast.   FEROSUL 325 (65 Fe) MG tablet Take 325 mg by mouth 2 (two) times daily.   furosemide (LASIX) 40 MG tablet Take 40 mg by mouth 2 (two) times daily.   levocetirizine (XYZAL) 5 MG tablet Take 5 mg by mouth every evening.   metroNIDAZOLE (FLAGYL) 500 MG tablet Take 1 tablet (500 mg total) by mouth 2 (two) times daily.   Multiple Vitamins-Minerals (ADEKS PO) Take by mouth daily at 6 (six) AM.   omeprazole (PRILOSEC) 10 MG capsule Take 10 mg by mouth daily.   SUPER B COMPLEX/C PO Take by mouth daily.   triamcinolone (NASACORT ALLERGY 24HR) 55 MCG/ACT AERO nasal inhaler Place 2 sprays into the nose daily.   vitamin A 3 MG (10000 UNITS) capsule Take 10,000 Units by mouth daily.   [DISCONTINUED] lactulose (CHRONULAC) 10 GM/15ML solution Take 30 mLs (20 g total) by mouth 2 (two) times daily.   [DISCONTINUED] spironolactone (ALDACTONE) 25 MG tablet Take 1 tablet (25 mg total) by mouth daily.     Allergies:   Aspirin-caffeine, Etodolac, Shrimp (diagnostic), and Toradol [ketorolac tromethamine]   Social History   Socioeconomic History   Marital status: Single    Spouse name: Not on file   Number of children: Not on file  Years of education: Not on file   Highest education level: Not on file  Occupational History   Not on file  Tobacco Use   Smoking status: Every Day    Packs/day: 1.00    Types: Cigarettes   Smokeless tobacco: Never  Vaping Use   Vaping Use: Never used  Substance and Sexual Activity   Alcohol use: Yes   Drug use: No   Sexual activity: Yes    Birth control/protection: None  Other Topics Concern   Not on file  Social History Narrative   Not on file   Social Determinants of Health   Financial Resource Strain: Not on file  Food Insecurity: No Food Insecurity (01/25/2022)   Hunger Vital Sign    Worried About Running Out of Food in the Last Year:  Never true    Ran Out of Food in the Last Year: Never true  Transportation Needs: No Transportation Needs (01/25/2022)   PRAPARE - Administrator, Civil Service (Medical): No    Lack of Transportation (Non-Medical): No  Physical Activity: Not on file  Stress: Not on file  Social Connections: Not on file     Family History: The patient's family history includes Bladder Cancer in her mother; Breast cancer in her mother; Diabetes in her father and mother; Hypertension in her mother; Kidney cancer in her mother. Sister has atrial fibrillation. Father has silent MI  ROS:   Please see the history of present illness.     All other systems reviewed and are negative.  EKGs/Labs/Other Studies Reviewed:    The following studies were reviewed today:  EKG:   01/13/22: SR   ECHO COMPLETE WO IMAGING ENHANCING AGENT 01/26/2022  Narrative ECHOCARDIOGRAM REPORT    Patient Name:   Lynn Andrade Date of Exam: 01/26/2022 Medical Rec #:  [id number]      Height:       65.5 in Accession #:    [id number]     Weight:       275.6 lb Date of Birth:  09-09-1970      BSA:          2.280 m Patient Age:    51 years       BP:           123/70 mmHg Patient Gender: F              HR:           69 bpm. Exam Location:  Inpatient  Procedure: 2D Echo, Cardiac Doppler and Color Doppler  Indications:    Cardiomyopathy  History:        Patient has no prior history of Echocardiogram examinations. Risk Factors:Hypertension and Diabetes.  Sonographer:    Eduard Roux Referring Phys: [id number] Cecille Po MELVIN  IMPRESSIONS   1. Left ventricular ejection fraction, by estimation, is 60 to 65%. The left ventricle has normal function. The left ventricle has no regional wall motion abnormalities. The left ventricular internal cavity size was moderately dilated. Left ventricular diastolic parameters are consistent with Grade I diastolic dysfunction (impaired relaxation). 2. Right ventricular  systolic function is normal. The right ventricular size is normal. 3. The mitral valve is normal in structure. No evidence of mitral valve regurgitation. No evidence of mitral stenosis. 4. The aortic valve was not well visualized. Aortic valve regurgitation is mild.  Comparison(s): No prior Echocardiogram.  FINDINGS Left Ventricle: Left ventricular ejection fraction, by estimation, is 60 to 65%. The left ventricle has normal  function. The left ventricle has no regional wall motion abnormalities. The left ventricular internal cavity size was moderately dilated. There is no left ventricular hypertrophy. Left ventricular diastolic parameters are consistent with Grade I diastolic dysfunction (impaired relaxation).  Right Ventricle: The right ventricular size is normal. No increase in right ventricular wall thickness. Right ventricular systolic function is normal.  Left Atrium: Left atrial size was normal in size.  Right Atrium: Right atrial size was normal in size.  Pericardium: There is no evidence of pericardial effusion.  Mitral Valve: The mitral valve is normal in structure. No evidence of mitral valve regurgitation. No evidence of mitral valve stenosis.  Tricuspid Valve: The tricuspid valve is normal in structure. Tricuspid valve regurgitation is not demonstrated. No evidence of tricuspid stenosis.  Aortic Valve: The aortic valve was not well visualized. Aortic valve regurgitation is mild. Aortic valve peak gradient measures 12.0 mmHg.  Pulmonic Valve: The pulmonic valve was normal in structure. Pulmonic valve regurgitation is not visualized. No evidence of pulmonic stenosis.  Aorta: The aortic root and ascending aorta are structurally normal, with no evidence of dilitation.  IAS/Shunts: No atrial level shunt detected by color flow Doppler.   LEFT VENTRICLE PLAX 2D LVIDd:         5.75 cm   Diastology LVIDs:         3.30 cm   LV e' medial:    7.13 cm/s LV PW:         0.95 cm   LV  E/e' medial:  12.1 LV IVS:        0.90 cm   LV e' lateral:   6.74 cm/s LVOT diam:     2.10 cm   LV E/e' lateral: 12.8 LV SV:         86 LV SV Index:   38 LVOT Area:     3.46 cm   RIGHT VENTRICLE RV Basal diam:  3.00 cm RV S prime:     17.40 cm/s TAPSE (M-mode): 2.5 cm  LEFT ATRIUM             Index        RIGHT ATRIUM           Index LA diam:        4.40 cm 1.93 cm/m   RA Area:     16.10 cm LA Vol (A2C):   71.6 ml 31.41 ml/m  RA Volume:   38.40 ml  16.84 ml/m LA Vol (A4C):   63.2 ml 27.72 ml/m LA Biplane Vol: 69.3 ml 30.40 ml/m AORTIC VALVE                 PULMONIC VALVE AV Area (Vmax): 2.58 cm     PV Vmax:       1.15 m/s AV Vmax:        173.00 cm/s  PV Peak grad:  5.3 mmHg AV Peak Grad:   12.0 mmHg LVOT Vmax:      129.00 cm/s LVOT Vmean:     81.200 cm/s LVOT VTI:       0.249 m  AORTA Ao Root diam: 3.20 cm Ao Asc diam:  3.20 cm  MITRAL VALVE MV Area (PHT): 2.54 cm    SHUNTS MV Decel Time: 299 msec    Systemic VTI:  0.25 m MV E velocity: 86.60 cm/s  Systemic Diam: 2.10 cm MV A velocity: 87.30 cm/s MV E/A ratio:  0.99   Recent Labs: 01/25/2022: B Natriuretic Peptide 25.5; TSH 1.943 01/26/2022:  Hemoglobin 11.6; Magnesium 2.2; Platelets 156 01/27/2022: ALT 25; BUN 17; Creatinine, Ser 0.90; Potassium 3.9; Sodium 139  Recent Lipid Panel No results found for: "CHOL", "TRIG", "HDL", "CHOLHDL", "VLDL", "LDLCALC", "LDLDIRECT"   Physical Exam:    VS:  BP (!) 116/58   Pulse 77   Ht 5' 5.5" (1.664 m)   Wt (!) 312 lb (141.5 kg)   SpO2 97%   BMI 51.13 kg/m     Wt Readings from Last 3 Encounters:  02/25/22 (!) 312 lb (141.5 kg)  01/27/22 297 lb (134.7 kg)  01/24/22 280 lb 3.2 oz (127.1 kg)    Gen: no distress, morbid obesity  Cardiac: No Rubs or Gallops, soft diastolic murmur, RRR , +2 radial pulses Respiratory: Clear to auscultation bilaterally, normal effort, normal respiratory rate GI: Soft, nontender, non-distended  MS: +1 LE edema;  moves all  extremities Integument: Skin feels warm Neuro:  At time of evaluation, alert and oriented to person/place/time/situation  Psych: Normal affect, patient feels OK   ASSESSMENT:    1. Chronic heart failure with preserved ejection fraction (HCC)   2. Morbid obesity (HCC)   3. Hyperlipidemia LDL goal <100   4. Hyperammonemic encephalopathy   5. History of bariatric surgery     PLAN:    HFpEF HTN with DM Morbid obesity with prior duodenal switch Hepatic disease with hyperammonemia Prior hypokalemia on metolazone - NYHA class I, Stage C, hypervolemic but much improved, etiology from obesity - Diuretic regimen: Lasix 40 mg PO BID  - continue aldactone 25 mg PO Daily - SGLT2i start - given K Bmp in 1-2 weeks  Mild AI - will get echo in 2026 unless new sx  Six months me or APP      Medication Adjustments/Labs and Tests Ordered: Current medicines are reviewed at length with the patient today.  Concerns regarding medicines are outlined above.  Orders Placed This Encounter  Procedures   Basic metabolic panel   Meds ordered this encounter  Medications   spironolactone (ALDACTONE) 25 MG tablet    Sig: Take 1 tablet (25 mg total) by mouth daily.    Dispense:  90 tablet    Refill:  3   dapagliflozin propanediol (FARXIGA) 10 MG TABS tablet    Sig: Take 1 tablet (10 mg total) by mouth daily before breakfast.    Dispense:  90 tablet    Refill:  3    Patient Instructions  Medication Instructions:  Your physician has recommended you make the following change in your medication:  START: dapagliflozin propanediol (Farxiga) 10 mg by mouth once daily before breakfast We have provided you with 30 days of samples, a 30 day free trial offer, and co pay card   REFILLED: spironolactone (Aldactone) 25 mg  *If you need a refill on your cardiac medications before your next appointment, please call your pharmacy*   Lab Work: IN 1-2 WEEKS: BMP If you have labs (blood work) drawn today and  your tests are completely normal, you will receive your results only by: MyChart Message (if you have MyChart) OR A paper copy in the mail If you have any lab test that is abnormal or we need to change your treatment, we will call you to review the results.   Testing/Procedures: NONE   Follow-Up: At [facility], you and your health needs are our priority.  As part of our continuing mission to provide you with exceptional heart care, we have created designated Provider Care Teams.  These Care  Teams include your primary Cardiologist (physician) and Advanced Practice Providers (APPs -  Physician Assistants and Nurse Practitioners) who all work together to provide you with the care you need, when you need it.   Your next appointment:   6 month(s)  The format for your next appointment:   In Person  Provider:   Christell Constant, MD      Important Information About Sugar         Signed, Christell Constant, MD  02/25/2022 11:12 AM    [facility]

## 2022-03-02 ENCOUNTER — Other Ambulatory Visit (INDEPENDENT_AMBULATORY_CARE_PROVIDER_SITE_OTHER): Payer: 59

## 2022-03-02 ENCOUNTER — Encounter: Payer: Self-pay | Admitting: Gastroenterology

## 2022-03-02 ENCOUNTER — Ambulatory Visit: Payer: 59 | Admitting: Gastroenterology

## 2022-03-02 VITALS — BP 120/64 | HR 80 | Ht 66.0 in | Wt 315.0 lb

## 2022-03-02 DIAGNOSIS — Z1211 Encounter for screening for malignant neoplasm of colon: Secondary | ICD-10-CM

## 2022-03-02 DIAGNOSIS — K7400 Hepatic fibrosis, unspecified: Secondary | ICD-10-CM

## 2022-03-02 DIAGNOSIS — K760 Fatty (change of) liver, not elsewhere classified: Secondary | ICD-10-CM

## 2022-03-02 LAB — IBC + FERRITIN
Ferritin: 33.8 ng/mL (ref 10.0–291.0)
Iron: 193 ug/dL — ABNORMAL HIGH (ref 42–145)
Saturation Ratios: 41.9 % (ref 20.0–50.0)
TIBC: 460.6 ug/dL — ABNORMAL HIGH (ref 250.0–450.0)
Transferrin: 329 mg/dL (ref 212.0–360.0)

## 2022-03-02 NOTE — Progress Notes (Signed)
03/02/2022 Lynn Andrade [id number] Aug 07, 1970   HISTORY OF PRESENT ILLNESS: This is a 51 year old female who looks like is a patient of Dr. Russella Dar from several years ago in 1992 when she had EGD and ERCP.  She has not been seen here since that time.  She is here now at the recommendation of her PCP, Ladora Daniel, PA-C, for evaluation of NASH/liver fibrosis on imaging.  Recent hospitalization with altered mental status and elevated ammonia level which normalized with lactulose.  Her platelets are normal, LFTs are normal except for just a minimally elevated bilirubin, and INR is normal.  She has been off of the lactulose for 2 and half weeks without any recurrent symptoms.  She had also had a lot of fluid retention and was on very high doses of diuretics prior to the onset of this confusion and altered mental status, and was thought to be dehydrated with hypokalemia as well.  They found normal EF on an ECHO with what sounds like minimal findings of CHF (grade 1 diastolic).  She tells me that she drinks a glass of red wine about every night.  Says she had a couple alcoholics in her family, but no known history of liver disease.  Has never had a colonoscopy in the past.  She had a duodenal switch in 2017.  Is on omeprazole OTC daily for acid reflux issues and overall that seems to control things pretty well.  In regards to her diuretics, she is now on Lasix 40 mg twice daily and spironolactone 25 mg daily.  She feels good at this point.  Says that she had mono in October 2022 and then COVID in July 2023 then all of these issues started in August.   Past Medical History:  Diagnosis Date   Acid reflux    Adjustment disorder with depressed mood 10/27/2009   Formatting of this note might be different from the original. Overview:  Adjustment Disorder With Depressed Mood   Chronic congestive heart failure (HCC)    Diabetes mellitus with coincident hypertension (HCC) 01/13/2022   Diabetes mellitus without  complication (HCC)    Edema    Essential hypertension 08/31/2010   Formatting of this note might be different from the original. Hypertension Formatting of this note might be different from the original. Overview:  Hypertension   Fluid retention    Hypertension    Past Surgical History:  Procedure Laterality Date   BILIOPANCREATIC DIVISION W DUODENAL SWITCH     CHOLECYSTECTOMY     DILATION AND CURETTAGE OF UTERUS     TONSILLECTOMY      reports that she has been smoking cigarettes. She has been smoking an average of 1 pack per day. She has never used smokeless tobacco. She reports current alcohol use. She reports that she does not use drugs. family history includes Bladder Cancer in her mother; Breast cancer in her mother; Diabetes in her father and mother; Hypertension in her mother; Kidney cancer in her mother. Allergies  Allergen Reactions   Aspirin-Caffeine Hives and Swelling   Etodolac Hives   Shrimp (Diagnostic) Hives   Toradol [Ketorolac Tromethamine]     unknown      Outpatient Encounter Medications as of 03/02/2022  Medication Sig   Biotin 10 MG CAPS Take 1 capsule by mouth daily.   Cholecalciferol (VITAMIN D3) 125 MCG (5000 UT) CAPS Take by mouth daily.   cyanocobalamin 1000 MCG tablet Take 1,000 mcg by mouth daily.   dapagliflozin propanediol (FARXIGA) 10  MG TABS tablet Take 1 tablet (10 mg total) by mouth daily before breakfast.   dapagliflozin propanediol (FARXIGA) 10 MG TABS tablet Take 1 tablet (10 mg total) by mouth daily before breakfast. (Patient taking differently: Take 5 mg by mouth daily before breakfast.)   FEROSUL 325 (65 Fe) MG tablet Take 325 mg by mouth 2 (two) times daily.   furosemide (LASIX) 40 MG tablet Take 40 mg by mouth 2 (two) times daily.   levocetirizine (XYZAL) 5 MG tablet Take 5 mg by mouth every evening.   Multiple Vitamins-Minerals (ADEKS PO) Take by mouth daily at 6 (six) AM.   omeprazole (PRILOSEC) 10 MG capsule Take 10 mg by mouth daily.    spironolactone (ALDACTONE) 25 MG tablet Take 1 tablet (25 mg total) by mouth daily.   SUPER B COMPLEX/C PO Take by mouth daily.   triamcinolone (NASACORT ALLERGY 24HR) 55 MCG/ACT AERO nasal inhaler Place 2 sprays into the nose daily.   vitamin A 3 MG (10000 UNITS) capsule Take 10,000 Units by mouth daily.   [DISCONTINUED] metroNIDAZOLE (FLAGYL) 500 MG tablet Take 1 tablet (500 mg total) by mouth 2 (two) times daily. (Patient not taking: Reported on 03/02/2022)   No facility-administered encounter medications on file as of 03/02/2022.    REVIEW OF SYSTEMS  : All other systems reviewed and negative except where noted in the History of Present Illness.   PHYSICAL EXAM: BP 120/64   Pulse 80   Ht 5\' 6"  (1.676 m)   Wt (!) 315 lb 4 oz (143 kg)   BMI 50.88 kg/m  General: Well developed white female in no acute distress Head: Normocephalic and atraumatic Eyes:  Sclerae anicteric, conjunctiva pink. Ears: Normal auditory acuity Lungs: Clear throughout to auscultation; no W/R/R. Heart: Regular rate and rhythm; no M/R/G. Musculoskeletal: Symmetrical with no gross deformities  Skin: No lesions on visible extremities Extremities:  Some B/:L LE pitting edema noted. Neurological: Alert oriented x 4, grossly non-focal Psychological:  Alert and cooperative. Normal mood and affect  ASSESSMENT AND PLAN: *51 year old female with what appears to be hepatic steatosis and possibly some liver fibrosis on imaging.  Recent hospitalization with altered mental status and elevated ammonia level which normalized with lactulose.  Her platelets are normal, LFTs are normal except for just a minimally elevated bilirubin, and INR is normal.  She has been off of the lactulose for 2 and half weeks without any recurrent symptoms.  She had also had a lot of fluid retention and was on very high doses of diuretics, thought to be dehydrated with hypokalemia as well.  Not sure that she has overt cirrhosis at this point.  We  will plan for ultrasound with elastography to start and will  perform serologies to rule out other causes of chronic liver disease.  She does drink 1 glass of wine every night and I have asked her to significantly decrease that. *Colorectal cancer screening: She has never had a colonoscopy in the past.  Her BMI is just over the weight limit for the [facility].  She has lost a lot of weight from her fluid retention and she is going to start exercising again, etc.  I am going to bring her back in about 4 to 6 weeks and will reweigh her and discuss colonoscopy again at that time hopefully can be performed in the [facility].  She tells me that her weight at home in the mornings without clothes is 307 pounds, which puts her below the 50 BMI mark. *  GERD: Is on omeprazole OTC daily, which controls her symptoms for the most part.  Likely had an EGD with her duodenal switch in 2017, but may consider EGD at the time of colonoscopy as well especially if suspected diagnosis of cirrhosis to screen for esophageal varices.   CC:  Tomasa Hose, NP CC:  Nicholes Rough, PA-C

## 2022-03-02 NOTE — Patient Instructions (Addendum)
If you are age 50 or younger, your body mass index should be between 19-25. Your Body mass index is 50.84 kg/m. If this is out of the aformentioned range listed, please consider follow up with your Primary Care Provider.  ________________________________________________________  The [place] [facility] providers would like to encourage you to use [facility] to communicate with providers for non-urgent requests or questions.  Due to long hold times on the telephone, sending your provider a message by [facility] may be a faster and more efficient way to get a response.  Please allow 48 business hours for a response.  Please remember that this is for non-urgent requests.  _______________________________________________________  Your provider has requested that you go to the basement level for lab work before leaving today. Press "B" on the elevator. The lab is located at the first door on the left as you exit the elevator.  You have been scheduled for an abdominal ultrasound at [facility] (1st floor of hospital) on 03-11-22 at 10:30am. Please arrive 30 minutes prior to your appointment for registration. Make certain not to have anything to eat or drink 6 hours prior to your appointment. Should you need to reschedule your appointment, please contact radiology at [phone]. This test typically takes about 30 minutes to perform.  Due to recent changes in healthcare laws, you may see the results of your imaging and laboratory studies on MyChart before your provider has had a chance to review them.  We understand that in some cases there may be results that are confusing or concerning to you. Not all laboratory results come back in the same time frame and the provider may be waiting for multiple results in order to interpret others.  Please give [place] 48 hours in order for your provider to thoroughly review all the results before contacting the office for clarification of your results.   You are scheduled to follow  up on 04-14-22 at 2pm.  Thank you for entrusting me with your care and choosing [facility].  Alonza Bogus, PA-C

## 2022-03-05 LAB — ALPHA-1-ANTITRYPSIN: A-1 Antitrypsin, Ser: 145 mg/dL (ref 83–199)

## 2022-03-05 LAB — TEST AUTHORIZATION

## 2022-03-05 LAB — HEPATITIS C ANTIBODY: Hepatitis C Ab: NONREACTIVE

## 2022-03-05 LAB — ANTI-SMOOTH MUSCLE ANTIBODY, IGG: Actin (Smooth Muscle) Antibody (IGG): <20 U (ref <20)

## 2022-03-05 LAB — CERULOPLASMIN: Ceruloplasmin: 21 mg/dL (ref 18–53)

## 2022-03-05 LAB — HEPATITIS B SURFACE ANTIBODY,QUALITATIVE: Hep B S Ab: NONREACTIVE

## 2022-03-05 LAB — MITOCHONDRIAL ANTIBODIES: Mitochondrial M2 Ab, IgG: <=20 U (ref <=20.0)

## 2022-03-05 LAB — IGG: IgG (Immunoglobin G), Serum: 1217 mg/dL (ref 600–1640)

## 2022-03-05 LAB — TISSUE TRANSGLUTAMINASE, IGA: (tTG) Ab, IgA: <1 U/mL

## 2022-03-05 LAB — ANA: Anti Nuclear Antibody (ANA): NEGATIVE

## 2022-03-05 LAB — HEPATITIS B SURFACE ANTIGEN: Hepatitis B Surface Ag: NONREACTIVE

## 2022-03-05 LAB — HEPATITIS A ANTIBODY, TOTAL: Hepatitis A AB,Total: REACTIVE — AB

## 2022-03-08 ENCOUNTER — Telehealth: Payer: Self-pay

## 2022-03-08 ENCOUNTER — Other Ambulatory Visit: Payer: Self-pay

## 2022-03-08 NOTE — Telephone Encounter (Signed)
**Note De-Identified Zak Gondek Obfuscation** Wilder Glade PA started through covermymeds. Key: [id number]

## 2022-03-11 ENCOUNTER — Ambulatory Visit (HOSPITAL_COMMUNITY)
Admission: RE | Admit: 2022-03-11 | Discharge: 2022-03-11 | Disposition: A | Discharge status: Home / Self Care | Payer: 59 | Source: Ambulatory Visit | Attending: Gastroenterology | Admitting: Gastroenterology

## 2022-03-11 DIAGNOSIS — K760 Fatty (change of) liver, not elsewhere classified: Secondary | ICD-10-CM | POA: Diagnosis not present

## 2022-03-11 DIAGNOSIS — K7400 Hepatic fibrosis, unspecified: Secondary | ICD-10-CM | POA: Insufficient documentation

## 2022-03-12 NOTE — Telephone Encounter (Signed)
**Note De-Identified Sanford Lindblad Obfuscation** Per letter received Laniqua Torrens fax from [employer], they denied coverage of Lynn Andrade but did not give a reason why. I called multiple numbers and s/w several people at [employer] concerning the denial and was finally advised by Tania Ade that another PA can be done and to answer Yes to question 6 which asks if the alternative meds on the pts plan may cause the pt harm or adverse reaction to all the alternatives.  Per Tania Ade, she is faxing me a Lynn Deist PA form to complete and to fax back to them.

## 2022-03-15 NOTE — Telephone Encounter (Signed)
**Note De-Identified Danielle Mink Obfuscation** Marcelline Deist PA form received from [employer].  I have completed it, added notes, an have e-mailed all to Dr Everlean Patterson nurse so she can obtain his signature, date it, and to fax all to [employer] at the fax number written on the cover letter included.

## 2022-03-23 NOTE — Telephone Encounter (Signed)
MD signed and RN Faxed on 03/22/22.

## 2022-03-23 NOTE — Telephone Encounter (Signed)
**Note De-Identified Maha Fischel Obfuscation** Letter received fro [employer] stating that they have approved [place] for coverage until 03/23/2023.  [facility] [place], [place] - [address] [facility] AT Portal to Registered [employer] Sites (Ph: [phone]) is aware of this approval.

## 2022-04-14 ENCOUNTER — Ambulatory Visit: Payer: 59 | Admitting: Gastroenterology

## 2022-04-17 ENCOUNTER — Other Ambulatory Visit: Payer: Self-pay | Admitting: Internal Medicine

## 2022-05-16 ENCOUNTER — Other Ambulatory Visit: Payer: Self-pay | Admitting: Internal Medicine

## 2022-08-01 IMAGING — DX DG CHEST 2V
2 series · 2 of 2 positions shown · non-contrast
Comparison: None.

CLINICAL DATA: Cough, sneezing, nasal congestion, sore throat and
body aches.

EXAM:
CHEST - 2 VIEW

[chest pa]
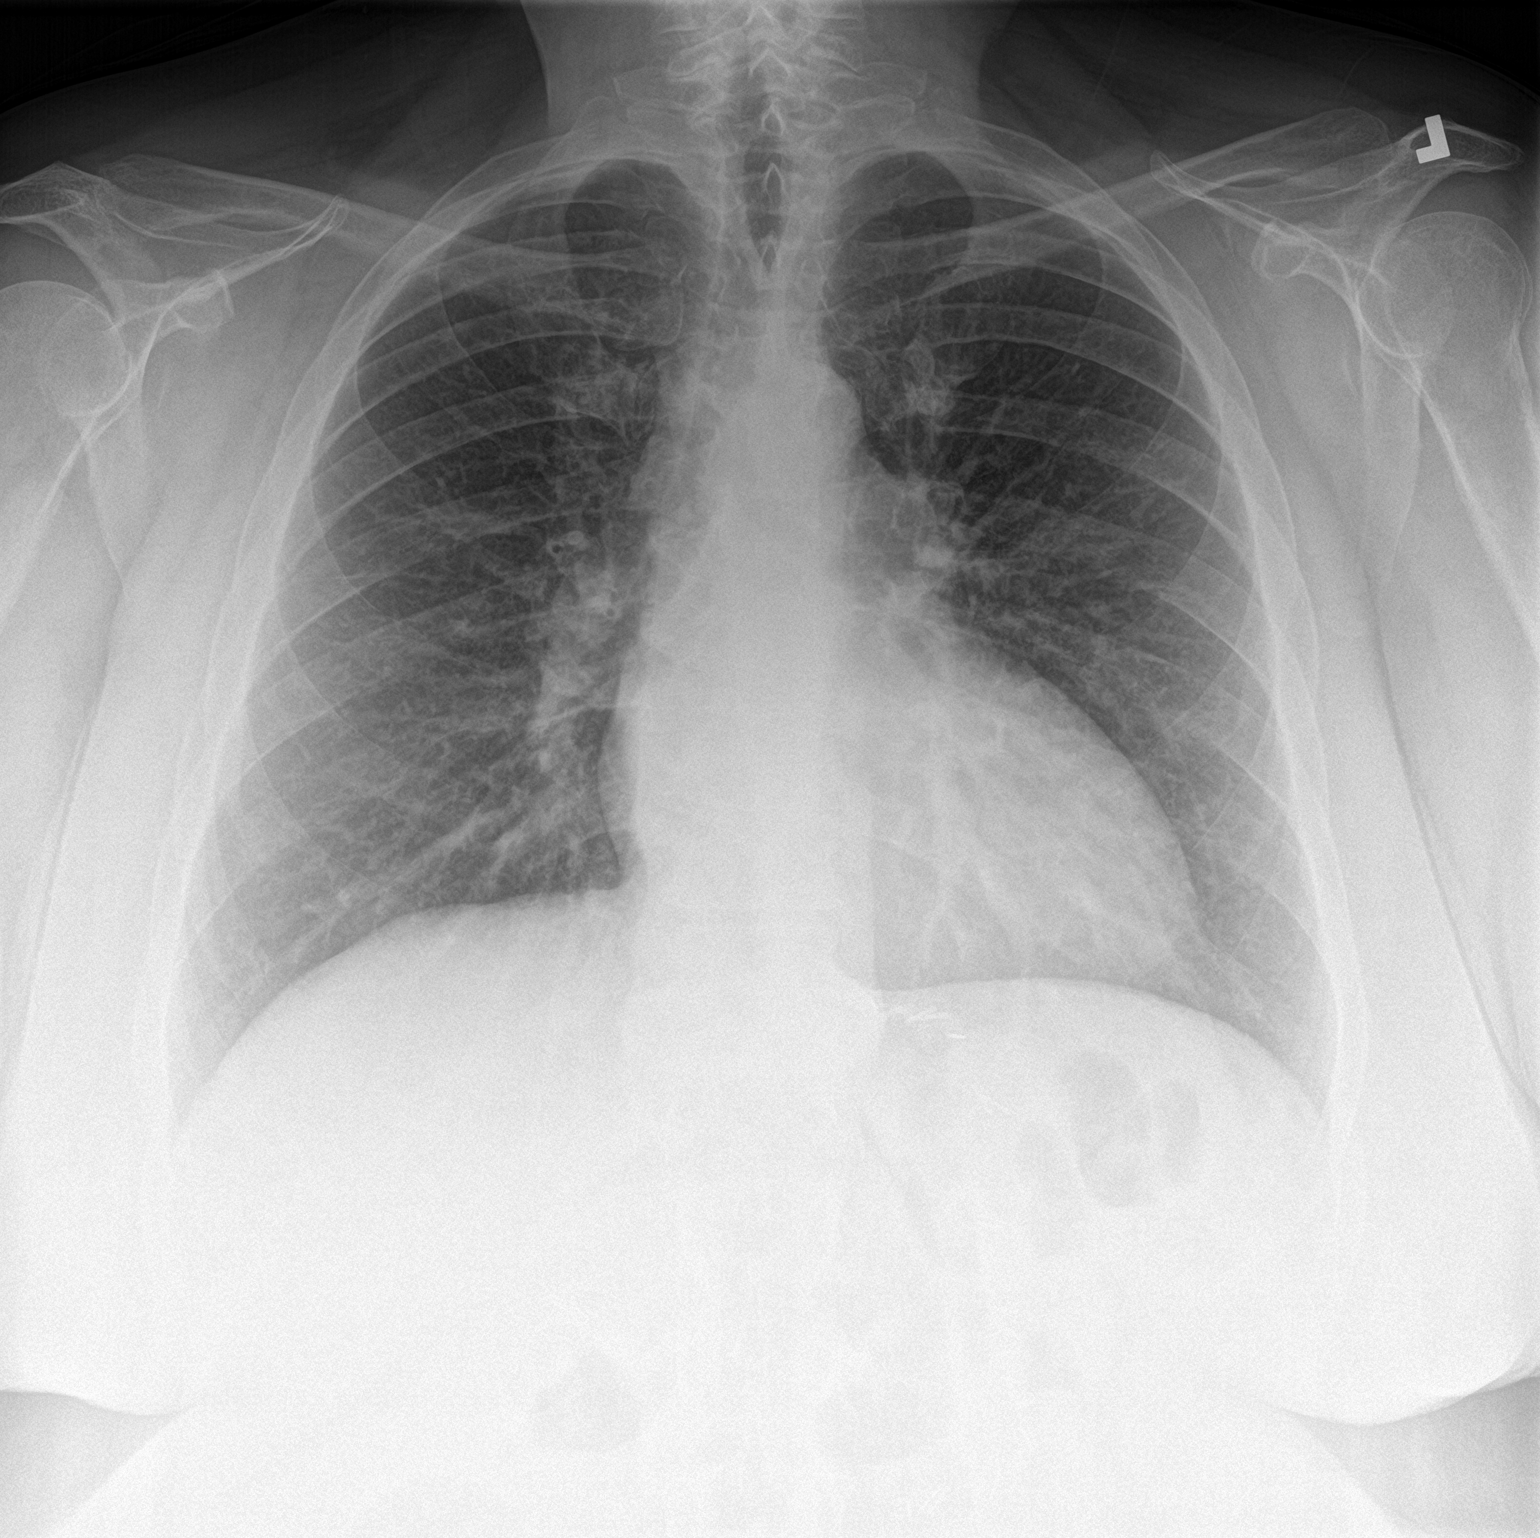

[chest lat]
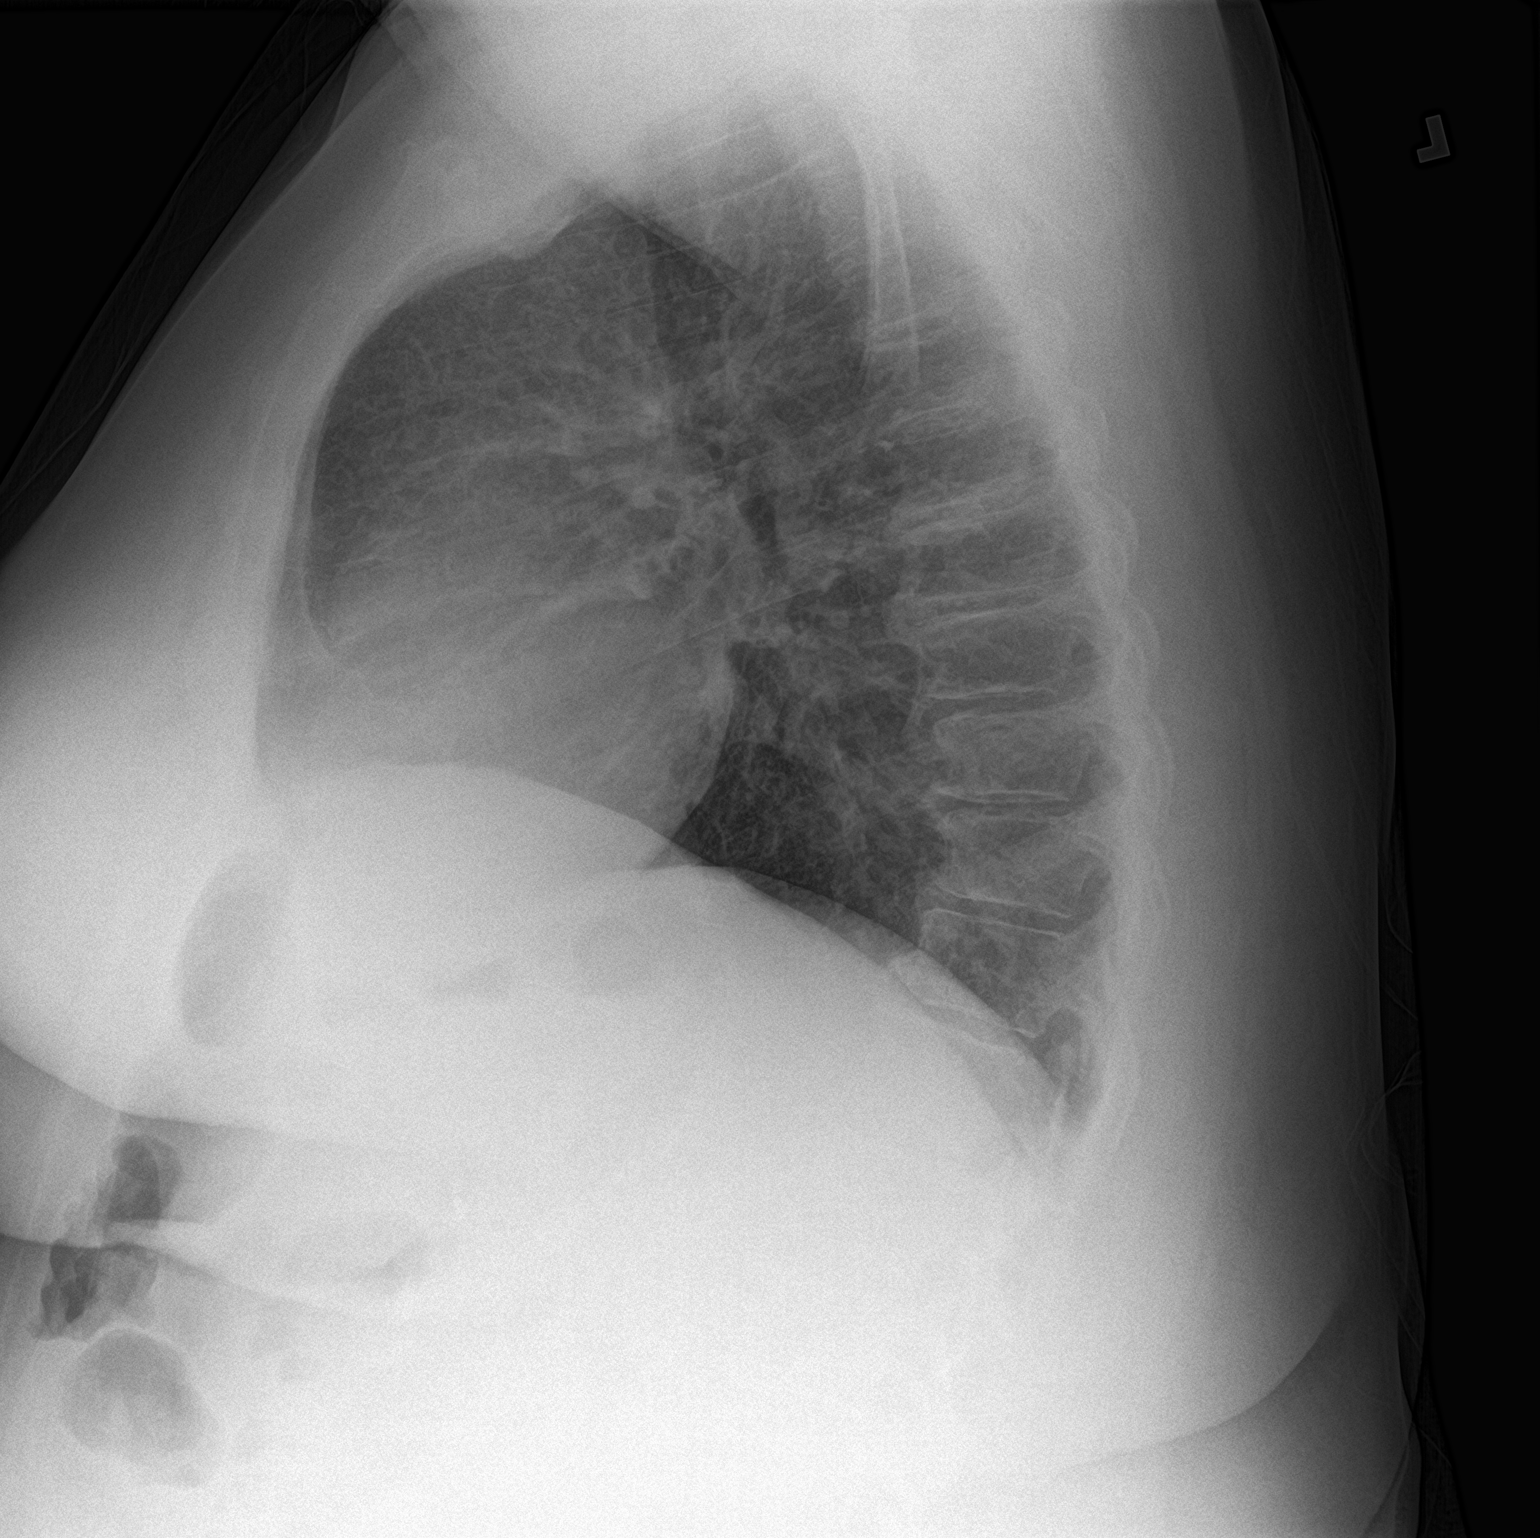

[2 of 2 positions shown; findings below may reference images not displayed]

FINDINGS: Trachea is midline. Heart size normal. Lungs are clear. No pleural
fluid. Degenerative changes in the spine.
IMPRESSION: No acute findings.

## 2023-02-23 ENCOUNTER — Other Ambulatory Visit: Payer: Self-pay | Admitting: Orthopedic Surgery

## 2023-02-23 DIAGNOSIS — M25512 Pain in left shoulder: Secondary | ICD-10-CM

## 2023-02-24 ENCOUNTER — Ambulatory Visit
Admission: RE | Admit: 2023-02-24 | Discharge: 2023-02-24 | Disposition: A | Discharge status: Home / Self Care | Payer: Managed Care, Other (non HMO) | Source: Ambulatory Visit | Attending: Orthopedic Surgery | Admitting: Orthopedic Surgery

## 2023-02-24 DIAGNOSIS — M25512 Pain in left shoulder: Secondary | ICD-10-CM

## 2023-03-03 ENCOUNTER — Encounter: Payer: Self-pay | Admitting: Orthopaedic Surgery

## 2023-03-03 ENCOUNTER — Ambulatory Visit (INDEPENDENT_AMBULATORY_CARE_PROVIDER_SITE_OTHER): Payer: Self-pay | Admitting: Orthopaedic Surgery

## 2023-03-03 VITALS — Ht 65.5 in | Wt 299.0 lb

## 2023-03-03 DIAGNOSIS — M25512 Pain in left shoulder: Secondary | ICD-10-CM

## 2023-03-03 NOTE — Progress Notes (Signed)
Office Visit Note   Patient: Lynn Andrade           Date of Birth: 01-Jan-1971           MRN: [id number] Visit Date: 03/03/2023              Requested by: Ladora Daniel, PA-C [address] [place],  [place] [address] PCP: Ladora Daniel, PA-C   Assessment & Plan: Visit Diagnoses:  1. Left shoulder pain, unspecified chronicity     Plan: We discussed she will have less discomfort in the beachchair position sleeping.  Use of her sling using her sling removed she can do some gentle circles clockwise counterclockwise as if she has paining on the floor.  We discussed some elephant swings.  When she gets some healing of her fracture she should get improvement in pain and then could begin more aggressive passive range of motion.  Patient states she wants to come back in 2 weeks to discuss this further.  CT scan was reviewed.  I discussed with her that I agree that nonoperative treatment is recommended.   Follow-Up Instructions: No follow-ups on file.   Orders:  No orders of the defined types were placed in this encounter.  No orders of the defined types were placed in this encounter.     Procedures: No procedures performed   Clinical Data: No additional findings.   Subjective: Chief Complaint  Patient presents with   Left Shoulder - Fracture    DOI 02/12/2023    HPI 52 year old female here concerning a second opinion for left shoulder injury.  She was at [facility] [place] loose board caused her to fall forward she does not exactly member where her arm was position but had acute pain in her left shoulder.  Radiographs were obtained ultimately a CT scan was obtained.  She states she had seen Dr. Darrelyn Hillock who is ordered an MRI scan and MRI was denied and a CT scan was performed.  Results as below she had mildly displaced glenoid fracture involving the third of the articular surface without significant step-off.  She also had small comminuted fracture  greater tuberosity with minimal displacement.  Shoulder was located she has had a sling but has it out of the car.  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  She has been using Tylenol and ibuprofen for pain.  Patient icing her head no loss of consciousness.  Review of Systems history of bariatric surgery weight loss greater than 100 pounds.  She has good ejection fraction on cardiac echo.  Problems with hyperlipidemia and sleep apnea.  All systems noncontributory to HPI.   Objective: Vital Signs: Ht 5' 5.5" (1.664 m)   Wt 299 lb (135.6 kg)   BMI 49.00 kg/m   Physical Exam Constitutional:      Appearance: She is well-developed.  HENT:     Head: Normocephalic.     Right Ear: External ear normal.     Left Ear: External ear normal. There is no impacted cerumen.  Eyes:     Pupils: Pupils are equal, round, and reactive to light.  Neck:     Thyroid: No thyromegaly.     Trachea: No tracheal deviation.  Cardiovascular:     Rate and Rhythm: Normal rate.  Pulmonary:     Effort: Pulmonary effort is normal.  Abdominal:     Palpations: Abdomen is soft.  Musculoskeletal:  Cervical back: No rigidity.  Skin:    General: Skin is warm and dry.  Neurological:     Mental Status: She is alert and oriented to person, place, and time.  Psychiatric:        Behavior: Behavior normal.     Ortho Exam sensation and finger flexion extension to the left hand is intact.  She has arm across her chest.  Specialty Comments:  No specialty comments available.  Imaging: Narrative & Impression  CLINICAL DATA:  Fall.  Left shoulder fracture   EXAM: CT OF THE UPPER LEFT EXTREMITY WITHOUT CONTRAST   TECHNIQUE: Multidetector CT imaging of the upper left extremity was performed according to the standard protocol.   RADIATION DOSE REDUCTION: This exam was performed according to the departmental dose-optimization program which includes automated exposure control,  adjustment of the mA and/or kV according to patient size and/or use of iterative reconstruction technique.   COMPARISON:  None Available.   FINDINGS: Bones/Joint/Cartilage   Acute fracture of the inferomedial aspect of the glenoid with up to 4 mm of medial displacement. Fracture involves approximately 1/3 of the articular surface of the glenoid. Mildly comminuted fracture of the greater tuberosity of the proximal left humerus with minimal displacement. No fracture of the humeral neck or humeral head articular surface. Glenohumeral joint alignment is maintained without dislocation. AC joint intact with mild-moderate osteoarthritic change. No additional fractures. No suspicious bone lesion.   Ligaments   Suboptimally assessed by CT.   Muscles and Tendons   Rotator cuff tendons suboptimally assessed by CT. No muscle atrophy.   Soft tissues   Moderate volume fluid within the subacromial-subdeltoid bursa. No hematoma about the shoulder. No left axillary lymphadenopathy. Visualized left lung field is clear. Aortic and coronary artery atherosclerosis is noted.   IMPRESSION: 1. Acute mildly displaced fracture of the glenoid. 2. Acute minimally displaced fracture of the greater tuberosity of the proximal left humerus. 3. Moderate volume fluid within the subacromial-subdeltoid bursa. 4. Aortic and coronary artery atherosclerosis (ICD10-I70.0).     Electronically Signed   By: Duanne Guess D.O.   On: 02/24/2023 10:45      Result History  CT SHOULDER LEFT WO CONTRAST (Order #[id number]) on 02/24/2023 - Order Result History Report  MyChart Results Release  MyChart Status: Active  Results Release   Encounter-Level Documents on 02/24/2023:  Electronic signature on 02/24/2023 9:27 AM - E-signed Document on 02/24/2023 9:38 AM by Eligah East D: auth Scan on 02/24/2023 9:27 AM by Default, Provider, MD   PMFS History: Patient Active Problem List   Diagnosis Date  Noted   Pain in left shoulder 03/03/2023   Liver fibrosis 03/02/2022   Hepatic steatosis 03/02/2022   Screening for colon cancer 03/02/2022   Chronic heart failure with preserved ejection fraction (HCC) 02/25/2022   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 Overview:  Adjustment Disorder With Depressed Mood   Chronic congestive heart failure (HCC)    Diabetes mellitus with coincident hypertension (HCC) 01/13/2022   Diabetes mellitus without complication (HCC)  Edema    Essential hypertension 08/31/2010   Formatting of this note might be different from the original. Hypertension Formatting of this note might be different from the original. Overview:  Hypertension   Fluid retention    Hypertension     Family History  Problem Relation Age of Onset   Diabetes Mother    Hypertension Mother    Bladder Cancer Mother    Breast cancer Mother    Kidney cancer Mother    Diabetes Father    Colon cancer Neg Hx    Esophageal cancer Neg Hx     Past Surgical History:  Procedure Laterality Date   BILIOPANCREATIC DIVISION W DUODENAL SWITCH     CHOLECYSTECTOMY     DILATION AND CURETTAGE OF UTERUS     TONSILLECTOMY     Social History   Occupational History   Not on file  Tobacco Use   Smoking status: Every Day    Current packs/day: 1.00    Types: Cigarettes   Smokeless tobacco: Never  Vaping Use   Vaping status: Never Used  Substance and Sexual Activity   Alcohol use: Yes   Drug  use: No   Sexual activity: Yes    Birth control/protection: None

## 2023-03-09 ENCOUNTER — Telehealth: Payer: Self-pay | Admitting: Radiology

## 2023-03-09 NOTE — Telephone Encounter (Signed)
Patient is requesting work note be emailed to her (2 copies) at [email or web address].    Could you please advise on how long patient will be out of work? She is a Interior and spatial designer.  CB for patient [phone]

## 2023-03-10 NOTE — Telephone Encounter (Signed)
Work note entered Financial controller as requested.

## 2023-03-17 ENCOUNTER — Other Ambulatory Visit (INDEPENDENT_AMBULATORY_CARE_PROVIDER_SITE_OTHER): Payer: Self-pay

## 2023-03-17 ENCOUNTER — Ambulatory Visit: Payer: Managed Care, Other (non HMO) | Admitting: Orthopaedic Surgery

## 2023-03-17 ENCOUNTER — Encounter: Payer: Self-pay | Admitting: Orthopaedic Surgery

## 2023-03-17 VITALS — Ht 65.5 in | Wt 299.0 lb

## 2023-03-17 DIAGNOSIS — M25512 Pain in left shoulder: Secondary | ICD-10-CM

## 2023-03-17 NOTE — Progress Notes (Signed)
Office Visit Note   Patient: Lynn Andrade           Date of Birth: December 30, 1970           MRN: [id number] Visit Date: 03/17/2023              Requested by: Ladora Daniel, PA-C [address] [place],  [place] [address] PCP: Ladora Daniel, PA-C   Assessment & Plan: Visit Diagnoses:  1. Left shoulder pain, unspecified chronicity     Plan: Patient will work on getting a pulley start working on active assistive range of motion get her arm up overhead.  Recheck 4 weeks.  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  She will work on this at home.  We discussed having her boyfriend get a pulley put it up in the closet door and went over multiple exercises she can participate in. Work slip given no work x 4 weeks.  This may have to be extended depending on her progress with the range of motion and strengthening. Follow-Up Instructions: No follow-ups on file.   Orders:  Orders Placed This Encounter  Procedures   XR Shoulder Left   No orders of the defined types were placed in this encounter.     Procedures: No procedures performed   Clinical Data: No additional findings.   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  She has been using her sling doing circles and elephant swings.  Review of Systems updated unchanged   Objective: Vital Signs: Ht 5' 5.5" (1.664 m)   Wt 299 lb (135.6 kg)   BMI 49.00 kg/m   Physical Exam Constitutional:      Appearance: She is well-developed.  HENT:     Head: Normocephalic.     Right Ear: External ear normal.     Left Ear:  External ear normal. There is no impacted cerumen.  Eyes:     Pupils: Pupils are equal, round, and reactive to light.  Neck:     Thyroid: No thyromegaly.     Trachea: No tracheal deviation.  Cardiovascular:     Rate and Rhythm: Normal rate.  Pulmonary:     Effort: Pulmonary effort is normal.  Abdominal:     Palpations: Abdomen is soft.  Musculoskeletal:     Cervical back: No rigidity.  Skin:    General: Skin is warm and dry.  Neurological:     Mental Status: She is alert and oriented to person, place, and time.  Psychiatric:        Behavior: Behavior normal.     Ortho Exam deltoid median ulnar sensation is intact.  Specialty Comments:  No specialty comments available.  Imaging: No results found.   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 Overview:  Adjustment Disorder With Depressed Mood   Chronic congestive heart failure (HCC)    Diabetes mellitus with coincident hypertension (HCC) 01/13/2022   Diabetes mellitus without complication (HCC)    Edema    Essential hypertension 08/31/2010   Formatting of this note might be different from the original. Hypertension Formatting of this note might be  different from the original. Overview:  Hypertension   Fluid retention    Hypertension     Family History  Problem Relation Age of Onset   Diabetes Mother    Hypertension Mother    Bladder Cancer Mother    Breast cancer Mother    Kidney cancer Mother    Diabetes Father    Colon cancer Neg Hx    Esophageal cancer Neg Hx     Past Surgical History:  Procedure Laterality Date   BILIOPANCREATIC DIVISION W DUODENAL SWITCH     CHOLECYSTECTOMY     DILATION AND CURETTAGE OF UTERUS     TONSILLECTOMY     Social History   Occupational History   Not on file  Tobacco Use   Smoking status: Every Day    Current packs/day: 1.00    Types: Cigarettes   Smokeless tobacco: Never  Vaping Use   Vaping status: Never Used  Substance and Sexual Activity   Alcohol use: Yes   Drug use: No   Sexual activity: Yes    Birth control/protection: None

## 2023-04-13 ENCOUNTER — Ambulatory Visit (INDEPENDENT_AMBULATORY_CARE_PROVIDER_SITE_OTHER): Payer: Self-pay | Admitting: Orthopaedic Surgery

## 2023-04-13 ENCOUNTER — Other Ambulatory Visit (INDEPENDENT_AMBULATORY_CARE_PROVIDER_SITE_OTHER): Payer: Self-pay

## 2023-04-13 ENCOUNTER — Encounter: Payer: Self-pay | Admitting: Orthopaedic Surgery

## 2023-04-13 VITALS — Ht 65.5 in | Wt 299.0 lb

## 2023-04-13 DIAGNOSIS — M25512 Pain in left shoulder: Secondary | ICD-10-CM

## 2023-04-13 MED ORDER — TRAMADOL HCL 50 MG PO TABS: 50.0000 mg | ORAL_TABLET | Freq: Two times a day (BID) | ORAL | 0 refills | Status: DC | PRN

## 2023-04-13 NOTE — Progress Notes (Signed)
Office Visit Note   Patient: Lynn Andrade           Date of Birth: October 03, 1970           MRN: [id number] Visit Date: 04/13/2023              Requested by: Ladora Daniel, PA-C [address] [place],  [place] [address] PCP: Ladora Daniel, PA-C   Assessment & Plan: Visit Diagnoses:  1. Left shoulder pain, unspecified chronicity     Plan: Patient will work her way out of the sling.  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  Recheck 6 7 weeks.  No x-ray needed on return.  She has been using plain Tylenol Ultram sent in she can take 1 at night if needed.  She had a gastric procedure for weight loss and cannot take anti-inflammatories.  Follow-Up Instructions: Return in about 6 weeks (around 05/25/2023).   Orders:  Orders Placed This Encounter  Procedures   XR Shoulder Left   Meds ordered this encounter  Medications   traMADol (ULTRAM) 50 MG tablet    Sig: Take 1 tablet (50 mg total) by mouth every 12 (twelve) hours as needed.    Dispense:  20 tablet    Refill:  0      Procedures: No procedures performed   Clinical Data: No additional findings.   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  She has been in the sling most of the time removes it for doing her pulley exercises.  Has not resumed doing hair.  Review of Systems updated unchanged.   Objective: Vital Signs: Ht 5' 5.5" (1.664 m)   Wt 299 lb (135.6 kg)   BMI 49.00 kg/m   Physical Exam Constitutional:      Appearance: She is well-developed.  HENT:     Head: Normocephalic.     Right Ear: External ear normal.     Left Ear: External ear  normal. There is no impacted cerumen.  Eyes:     Pupils: Pupils are equal, round, and reactive to light.  Neck:     Thyroid: No thyromegaly.     Trachea: No tracheal deviation.  Cardiovascular:     Rate and Rhythm: Normal rate.  Pulmonary:     Effort: Pulmonary effort is normal.  Abdominal:     Palpations: Abdomen is soft.  Musculoskeletal:     Cervical back: No rigidity.  Skin:    General: Skin is warm and dry.  Neurological:     Mental Status: She is alert and oriented to person, place, and time.  Psychiatric:        Behavior: Behavior normal.     Ortho Exam Station hand is intact.  She can abduct with her arm in the sling elbow flexed but has problems with shoulder flexion.  Continue use of the pulley and gradually work on range of motion and strengthening on her own.  Specialty Comments:  No specialty comments available.  Imaging: XR Shoulder Left  Result Date: 04/13/2023 2 views left shoulder obtained and reviewed.  Greater tuberosity nondisplaced fracture again noted without displacement.  Glenoid fracture not appreciated on plain radiograph. Impression: Left greater tuberosity fracture with some early healing.  Glenoid fracture is not well-visualized on these images.    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 Overview:  Adjustment Disorder With Depressed Mood   Chronic congestive heart failure (HCC)    Diabetes mellitus with coincident hypertension (HCC) 01/13/2022   Diabetes mellitus without complication (HCC)    Edema    Essential hypertension 08/31/2010   Formatting of this note might be different from the original. Hypertension Formatting of this note might be different from the original. Overview:  Hypertension   Fluid retention    Hypertension     Family History  Problem Relation Age of Onset   Diabetes Mother    Hypertension Mother    Bladder Cancer Mother    Breast cancer Mother    Kidney cancer Mother    Diabetes Father    Colon cancer Neg Hx    Esophageal cancer Neg Hx     Past Surgical History:  Procedure Laterality Date   BILIOPANCREATIC DIVISION W DUODENAL SWITCH     CHOLECYSTECTOMY     DILATION AND CURETTAGE OF UTERUS     TONSILLECTOMY     Social History   Occupational History   Not on file  Tobacco Use   Smoking status: Every Day    Current packs/day: 1.00    Types: Cigarettes   Smokeless tobacco: Never  Vaping Use   Vaping status: Never Used  Substance and Sexual Activity   Alcohol use: Yes   Drug use: No   Sexual activity: Yes    Birth control/protection: None

## 2023-05-25 ENCOUNTER — Ambulatory Visit: Payer: Self-pay | Admitting: Orthopaedic Surgery

## 2023-07-12 DIAGNOSIS — F17210 Nicotine dependence, cigarettes, uncomplicated: Secondary | ICD-10-CM | POA: Diagnosis not present

## 2023-07-12 DIAGNOSIS — Z760 Encounter for issue of repeat prescription: Secondary | ICD-10-CM | POA: Diagnosis not present

## 2023-07-12 DIAGNOSIS — L089 Local infection of the skin and subcutaneous tissue, unspecified: Secondary | ICD-10-CM | POA: Diagnosis not present

## 2023-07-12 DIAGNOSIS — Z20822 Contact with and (suspected) exposure to covid-19: Secondary | ICD-10-CM | POA: Diagnosis not present

## 2023-07-12 DIAGNOSIS — J988 Other specified respiratory disorders: Secondary | ICD-10-CM | POA: Diagnosis not present

## 2023-07-12 DIAGNOSIS — J101 Influenza due to other identified influenza virus with other respiratory manifestations: Secondary | ICD-10-CM | POA: Diagnosis not present

## 2023-07-12 DIAGNOSIS — B9689 Other specified bacterial agents as the cause of diseases classified elsewhere: Secondary | ICD-10-CM | POA: Diagnosis not present

## 2023-10-08 DIAGNOSIS — R051 Acute cough: Secondary | ICD-10-CM | POA: Diagnosis not present

## 2023-10-08 DIAGNOSIS — R062 Wheezing: Secondary | ICD-10-CM | POA: Diagnosis not present

## 2023-10-08 DIAGNOSIS — B9689 Other specified bacterial agents as the cause of diseases classified elsewhere: Secondary | ICD-10-CM | POA: Diagnosis not present

## 2023-10-08 DIAGNOSIS — J988 Other specified respiratory disorders: Secondary | ICD-10-CM | POA: Diagnosis not present

## 2024-01-05 NOTE — ED Provider Notes (Signed)
 [facility]  ED Provider Note  Lynn Andrade 52 y.o. female DOB: 1970-09-23 MRN: [id number] History   Chief Complaint  Patient presents with  . Vomiting    C/o generalized weakness fatigue , n/v/d , h/a x 6 days   The patient had presented to [facility] ED for evaluation of multiple complaints and concerns.  She reports intermittent episodes of nausea vomiting diarrhea over the last several days.  She reports generalized headache.  She reported subjective fevers no known sick contacts.  The patient reports that she has been seen by her primary care provider and diagnosed with a UTI she was started on Macrobid .   History provided by:  Patient Vomiting Associated symptoms: diarrhea   Associated symptoms: no chills        Past Medical History:  Diagnosis Date  . Abnormal weight gain    History of Recent Weight Gain (___ Lbs)  . Adjustment disorder with depressed mood    History of Grief Reaction  . Asthma (*)   . Candidiasis of skin and nails    History of Cutaneous Candidiasis  . Candidiasis of skin and nails    History of Cutaneous Candidiasis  . Candidiasis of vulva and vagina    History of Vaginal Candidiasis  . Cellulitis and abscess of other specified site    History of Cellulitis Of The Scalp  . Chronic sinus infection   . Depression   . Dermatophytosis of nail    History of Onychomycosis  . GERD (gastroesophageal reflux disease)   . Hemorrhoids   . Herpes zoster without mention of complication    History of Herpes Zoster (Shingles)  . Hyperlipidemia   . Hypertension   . Impaired fasting glucose    History of Impaired Fasting Glucose  . Injury, other and unspecified, knee, leg, ankle, and foot    History of Foot Injury  . Open wound(s) (multiple) of unspecified site(s), without mention of complication    History of Multiple Open Wounds  . Pain in joint, lower leg    History of Joint Pain, Localized In The Knee - bilateral knee  pain secondary to weight  . Phlebitis and thrombophlebitis of superficial vessels of lower extremities    History of Thrombophlebitis Of Superficial Vessels Of The Lower Extremity  . Thyrotoxicosis without mention of goiter or other cause, without mention of thyrotoxic crisis or storm    History of Hyperthyroidism  . Unspecified ectopic pregnancy without intrauterine pregnancy (*)    History of Ectopic Pregnancy  . Unspecified viral infection, in conditions classified elsewhere and of unspecified site    History of Viral Infection    Past Surgical History:  Procedure Laterality Date  . Cholecystectomy     53 y/o  . Dilation and curettage of uterus  05/04/2003  . Other surgical history     History of Tonsillectomy With Adenoidectomy  . Other surgical history     History of Uvulectomy  . Other surgical history     History of Rhinoplasty  . Other surgical history     History of Dilation And Curettage  . Other surgical history     History of Cholecystectomy Laparoscopic - Suffered a massive bile duct leak and was hospitalized 17 days post-op  . Tonsillectomy and adenoidectomy  05/04/1999   uvula, septum  . Upper gastrointestinal endoscopy  1991    Social History   Substance and Sexual Activity  Alcohol Use Yes  . Alcohol/week: 0.0 standard drinks of  alcohol   Comment: occ   Tobacco Use History[1] E-Cigarettes  . Vaping Use Never User   . Start Date    . Cartridges/Day    . Quit Date     Social History   Substance and Sexual Activity  Drug Use No         Allergies[2]  Discharge Medication List as of 01/05/2024 12:17 PM     CONTINUE these medications which have NOT CHANGED   Details  albuterol  sulfate HFA (PROVENTIL ,VENTOLIN ,PROAIR ) 108 (90 Base) MCG/ACT inhaler INHALE 2 PUFFS BY MOUTH EVERY 6 HOURS AS NEEDED FOR WHEEZING AND FOR SHORTNESS OF BREATH, Normal    Ascorbic Acid (VITAMIN C PO) Take by mouth., Historical Med    azelastine (ASTELIN) 0.1 % nasal spray  one spray by Nasal route 2 (two) times daily., Starting Wed 07/21/2022, Normal    B Complex Vitamins (VITAMIN B COMPLEX PO) Take by mouth., Until Discontinued, Historical Med    Biotin 10 MG CAPS Take by mouth., Until Discontinued, Historical Med    budesonide-formoterol (SYMBICORT) 160-4.5 mcg/actuation inhaler Inhale two puffs into the lungs 2 (two) times daily., Starting Wed 07/21/2022, Normal    Calcium Carbonate-Vitamin D  (CALTRATE 600+D PO) Take by mouth 4 (four) times daily., Historical Med    Cyanocobalamin  (B-12 PO) Take by mouth daily., Historical Med    FARXIGA  10 MG tablet Take one tablet (10 mg dose) by mouth every morning., Starting Thu 08/12/2022, Historical Med    furosemide  (LASIX ) 40 mg tablet Take one tablet (40 mg dose) by mouth 2 (two) times daily., Starting Wed 11/30/2023, Normal    IRON PO Take by mouth daily., Historical Med    levocetirizine (XYZAL) 5 MG tablet Take one tablet (5 mg dose) by mouth every evening., Historical Med    levothyroxine  sodium (SYNTHROID ) 25 mcg tablet Take one tablet (25 mcg dose) by mouth daily. Take in the morning on an empty stomach and wait 30 minutes to eat or take other meds., Starting Fri 12/30/2023, Normal    metolazone  (ZAROXOLYN ) 5 mg tablet Take one tablet (5 mg dose) by mouth daily as needed., Starting Tue 12/27/2023, Normal    MILK THISTLE PO Take by mouth 2 (two) times daily., Historical Med    Multiple Vitamins-Minerals (ADEKS PO) Take by mouth 4 (four) times daily., Historical Med    Multiple Vitamins-Minerals (ZINC PO) Take by mouth., Historical Med    nitrofurantoin , macrocrystal-monohydrate, (MACROBID ) 100 mg capsule Take one capsule (100 mg dose) by mouth 2 (two) times daily for 7 days., Starting Tue 01/03/2024, Until Tue 01/10/2024, Normal    traZODone (DESYREL) 50 mg tablet Take 0.5 to 1 tablet at bedtime., Normal    valacyclovir (VALTREX) 1000 mg tablet Take two tablets (2,000 mg dose) by mouth 2 (two) times daily for 1  day. As needed for cold sores., Starting Fri 11/19/2022, Until Tue 12/27/2023, Normal    vitamin A 10000 UNIT capsule Take one capsule (10,000 Units dose) by mouth daily., Historical Med    vitamin D3, cholecalciferol , (OPTIMAL-D) 50,000 units CAPS Take one capsule by mouth once a week., Starting Wed 12/28/2023, Normal        Primary Survey  Primary Survey  Review of Systems   Review of Systems  Unable to perform ROS: Psychiatric disorder  Constitutional:  Negative for chills and fever.  Gastrointestinal:  Positive for diarrhea, nausea and vomiting.  All other systems reviewed and are negative.   Physical Exam   ED Triage Vitals [01/05/24 0924]  BP 132/73  Heart Rate 83  Resp 16  SpO2 100 %  Temp 97.8 F (36.6 C)    Physical Exam  Nursing note and vitals reviewed. Constitutional: She appears well-developed and well-nourished. She does not appear distressed and does not appear ill.  Eyes: EOM are intact. Conjunctivae are normal.  Neck: Neck supple. No JVD.  Cardiovascular: Normal rate.  Pulmonary/Chest: Respiratory effort normal and breath sounds normal.  Abdominal: Soft. There is no abdominal tenderness.  Musculoskeletal:     Cervical back: Neck supple.   Lymphadenopathy:    No cervical adenopathy.  Neurological: She is alert and oriented to person, place, and time.  Skin: Skin is dry.     ED Course   Lab results:   CBC AND DIFFERENTIAL - Abnormal      Result Value   WBC 4.0     RBC 3.08 (*)    HGB 9.8 (*)    HCT 29.8 (*)    MCV 96.8 (*)    MCH 31.8     MCHC 32.9     Plt Ct 126 (*)    RDW SD 58.7 (*)    MPV 9.8     NRBC% 0.0     Absolute NRBC Count 0.00     NEUTROPHIL % 63.3     LYMPHOCYTE % 23.6     MONOCYTE % 11.2     Eosinophil % 1.2     BASOPHIL % 0.5     IG% 0.2     ABSOLUTE NEUTROPHIL COUNT 2.55     ABSOLUTE LYMPHOCYTE COUNT 0.95 (*)    Absolute Monocyte Count 0.45     Absolute Eosinophil Count 0.05     Absolute Basophil Count 0.02      Absolute Immature Granulocyte Count 0.01    COMPREHENSIVE METABOLIC PANEL - Abnormal   Na 141     Potassium 3.3 (*)    Cl 104     CO2 28     AGAP 9     Glucose 130 (*)    BUN 12     Creatinine 0.75     Comment: Slight icterus present, results may be adversely affected.  Please interpret results with caution.    Ca 8.9     ALK PHOS 102     T Bili 2.5 (*)    Total Protein 6.4     Alb 3.0 (*)    GLOBULIN 3.4     ALBUMIN /GLOBULIN RATIO 0.9 (*)    BUN/CREAT RATIO 16.0     ALT 16     AST 31     eGFR 96     Comment: Normal GFR (glomerular filtration rate) > 60 mL/min/1.73 meters squared, < 60 may include impaired kidney function. Calculation based on the Chronic Kidney Disease Epidemiology Collaboration (CK-EPI)equation refit without adjustment for race.  URINALYSIS W/MICRO REFLEX CULTURE - SYMPTOMATIC - Abnormal   Urine Color Yellow     Urine Clarity Clear     Urine Specific Gravity 1.021     Urine pH 8.0     Urine Protein - Dipstick 10 (*)    Urine Glucose Negative     Urine Ketones Negative     Urine Bilirubin 0.5 (*)    Urine Blood Negative     Urine Nitrite Negative     Urine Urobilinogen 12 (*)    Urine Leukocyte Esterase 75 (*)    Urine Squamous Epithelial Cells 3-4 (*)    Urine WBC 3-5 (*)  Urine RBC 0-2     Urine Bacteria 1+ (*)    Urine Mucous Trace     UA Microscopic Yes Micro (*)    Narrative:    Does not meet criteria for reflex to Urine Culture.  COVID-19, FLU A+B AND RSV - Normal   Flu A Negative     Flu B Negative     RSV PCR Negative     SARS-COV-2 Not Detected     Narrative:    SARS-COV-2 (COVID-19)PCR-Negative results do not preclude SARS-CoV-2 infection and should not be used as the sole basis for patient management decisions. Negative results must be combined with clinical observations, patient history, and epidemiological information.  Flu and/or RSV - Negative results do not preclude the presence of Flu or RSV virus and should not be used as the  sole basis for treatment or other patient management decisions. False negative results may occur if virus is present at levels below the analytical limit of detection.  This test detects Influenza A, Influenza B, and Respiratory Syncytial Virus and SARS-COV-2 (COVID-19) by PCR.     LIGHT BLUE TOP  LAVENDER TOP  MINT GREEN-TOP TUBE (PST GEL/LI HEP)  GOLD SST    Imaging:   CT CHEST ABDOMEN PELVIS W IV CONTRAST   Narrative:    INDICATION: Painless Jaundice..... COMPARISON:  Abdomen/pelvis CT November 16, 2022.      TECHNIQUE:  CT CHEST ABDOMEN PELVIS W IV CONTRAST - 100 mL  Isovue-370 administered IV. Dose reduction was utilized (automated exposure control, mA or kV adjustment based on patient size, or iterative image reconstruction).  FINDINGS:  CHEST: Scattered small nonspecific mediastinal and hilar nodes, favored to be reactive. Aorta and main pulmonary artery normal in size. Mild coronary arterial sclerosis. Heart size normal. No pericardial effusion.  No consolidation or pleural effusion. Small focus of peribronchial atelectasis in the anterior right upper lobe. Mild diffuse bronchial wall thickening.   Abdomen/pelvis: Ill-defined hypoattenuating focus at the junction of the left and right hepatic lobes appears new from July 2024. Portal circulation patent. Prior cholecystectomy. No splenomegaly. Pancreas normal. No biliary or pancreatic duct dilation.  Adrenal glands normal.  No suspicious solid renal lesion. Small cyst at the upper pole of the right kidney unchanged. No hydronephrosis. Urinary bladder not well evaluated. Limited evaluation of the uterus and adnexa.  Mild atherosclerotic plaque. No AAA. No suspicious adenopathy.  Again noted is engorged portosystemic collateral vessels in the right lower quadrant between the mesentery and right gonadal vessels. No venous congestion in the pelvis.  No bowel obstruction. Prior partial gastric resection. Small hiatal hernia.  Appendix not visualized.  Subcutaneous edema in the lower abdominal wall soft tissues.  Senescent changes of the osseous structures.    Impression:    IMPRESSION: No acute cardiopulmonary process identified. No evidence of biliary obstruction. Nonspecific hypoattenuating focus in the liver not well evaluated with CT. Recommend short-term follow-up MRI to better characterize.  Electronically Signed by: Lamar Berber IV on 01/05/2024 11:44 AM     ECG: ECG Results   None                                                                        Pre-Sedation Procedures  Medical Decision Making The patient is given IV fluids along with Zofran  and improvement routine laboratory studies were obtained and were unremarkable she does have evidence of UTI and is currently on Macrobid  she is able to tolerate oral fluids well.   CT chest abdomen pelvis showed no evidence of any acute abnormality there was evidence in the liver will have a hyperal attenuated area of unclear etiology with recommended follow-up.  This was discussed at detail with the patient and she makes reference to having a prior cholecystectomy of multiple surgeries and scar tissue in her liver that has been reported before.  The patient was advised to readdress it and follow-up with primary care provider.  She was discharged home with a prescription for Zofran .  Amount and/or Complexity of Data Reviewed Labs: ordered. Radiology: ordered.  Risk Prescription drug management.          Provider Communication  Discharge Medication List as of 01/05/2024 12:17 PM     START taking these medications   Details  ondansetron  (ZOFRAN -ODT) 4 mg disintegrating tablet Take one tablet (4 mg dose) by mouth every 8 (eight) hours as needed for up to 7 days., Starting Thu 01/05/2024, Until Thu 01/12/2024 at 2359, Normal        Discharge Medication List as of 01/05/2024 12:17 PM      Discharge  Medication List as of 01/05/2024 12:17 PM      Clinical Impression Final diagnoses:  Vomiting, unspecified vomiting type, unspecified whether nausea present    ED Disposition     ED Disposition  Discharge   Condition  Stable   Comment  --                 Follow-up Information     Tylene KATHEE Meres, PA-C.   Specialty: Family Medicine Contact information: [address] [place] [address] [phone]                  Electronically signed by:       [1] Social History Tobacco Use  Smoking Status Every Day  . Current packs/day: 1.00  . Average packs/day: 1 pack/day for 29.7 years (29.7 ttl pk-yrs)  . Types: Cigarettes  . Start date: 05/05/1994  . Passive exposure: Never  Smokeless Tobacco Never  [2] Allergies Allergen Reactions  . Bayer Back & Body Pain Ex St [Aspirin-Caffeine] Hives and Swelling  . Etodolac Hives    Severe reaction; knots all over/burning, went to ER  . Ketorolac Tromethamine Unknown    unknown  . Shrimp (Diagnostic) Hives  . Skin Adhesives Rash    **PAPER TAPE OK**   Deward Gilmore Constant, MD 01/05/24 [phone]

## 2024-01-15 NOTE — Telephone Encounter (Signed)
 Reason for Disposition . [1] MODERATE diarrhea (e.g., 4-6 times / day more than normal) AND [2] present > 48 hours (2 days)  Protocols used: Diarrhea-A-AH

## 2024-01-18 ENCOUNTER — Inpatient Hospital Stay (HOSPITAL_COMMUNITY)
Admission: EM | Admit: 2024-01-18 | Discharge: 2024-01-21 | DRG: 371 | Disposition: A | Discharge status: Home / Self Care | Attending: Family Medicine | Admitting: Family Medicine

## 2024-01-18 ENCOUNTER — Encounter (HOSPITAL_COMMUNITY): Payer: Self-pay

## 2024-01-18 ENCOUNTER — Other Ambulatory Visit: Payer: Self-pay

## 2024-01-18 ENCOUNTER — Observation Stay (HOSPITAL_COMMUNITY)

## 2024-01-18 DIAGNOSIS — Z8051 Family history of malignant neoplasm of kidney: Secondary | ICD-10-CM

## 2024-01-18 DIAGNOSIS — Z8052 Family history of malignant neoplasm of bladder: Secondary | ICD-10-CM

## 2024-01-18 DIAGNOSIS — D638 Anemia in other chronic diseases classified elsewhere: Secondary | ICD-10-CM | POA: Diagnosis present

## 2024-01-18 DIAGNOSIS — I5033 Acute on chronic diastolic (congestive) heart failure: Secondary | ICD-10-CM | POA: Diagnosis present

## 2024-01-18 DIAGNOSIS — T501X6A Underdosing of loop [high-ceiling] diuretics, initial encounter: Secondary | ICD-10-CM | POA: Diagnosis present

## 2024-01-18 DIAGNOSIS — Z6841 Body Mass Index (BMI) 40.0 and over, adult: Secondary | ICD-10-CM | POA: Diagnosis not present

## 2024-01-18 DIAGNOSIS — A09 Infectious gastroenteritis and colitis, unspecified: Principal | ICD-10-CM

## 2024-01-18 DIAGNOSIS — I5031 Acute diastolic (congestive) heart failure: Secondary | ICD-10-CM | POA: Diagnosis not present

## 2024-01-18 DIAGNOSIS — Z8249 Family history of ischemic heart disease and other diseases of the circulatory system: Secondary | ICD-10-CM | POA: Diagnosis not present

## 2024-01-18 DIAGNOSIS — E860 Dehydration: Secondary | ICD-10-CM | POA: Diagnosis present

## 2024-01-18 DIAGNOSIS — E876 Hypokalemia: Secondary | ICD-10-CM | POA: Diagnosis present

## 2024-01-18 DIAGNOSIS — R197 Diarrhea, unspecified: Secondary | ICD-10-CM | POA: Diagnosis present

## 2024-01-18 DIAGNOSIS — Z87891 Personal history of nicotine dependence: Secondary | ICD-10-CM | POA: Diagnosis not present

## 2024-01-18 DIAGNOSIS — Z833 Family history of diabetes mellitus: Secondary | ICD-10-CM

## 2024-01-18 DIAGNOSIS — I110 Hypertensive heart disease with heart failure: Secondary | ICD-10-CM | POA: Diagnosis present

## 2024-01-18 DIAGNOSIS — Z803 Family history of malignant neoplasm of breast: Secondary | ICD-10-CM

## 2024-01-18 DIAGNOSIS — R1110 Vomiting, unspecified: Secondary | ICD-10-CM | POA: Diagnosis present

## 2024-01-18 DIAGNOSIS — Z79899 Other long term (current) drug therapy: Secondary | ICD-10-CM

## 2024-01-18 DIAGNOSIS — M549 Dorsalgia, unspecified: Secondary | ICD-10-CM | POA: Diagnosis present

## 2024-01-18 DIAGNOSIS — Z91148 Patient's other noncompliance with medication regimen for other reason: Secondary | ICD-10-CM

## 2024-01-18 DIAGNOSIS — K219 Gastro-esophageal reflux disease without esophagitis: Secondary | ICD-10-CM | POA: Diagnosis present

## 2024-01-18 DIAGNOSIS — A0472 Enterocolitis due to Clostridium difficile, not specified as recurrent: Principal | ICD-10-CM | POA: Diagnosis present

## 2024-01-18 DIAGNOSIS — R112 Nausea with vomiting, unspecified: Secondary | ICD-10-CM | POA: Diagnosis present

## 2024-01-18 DIAGNOSIS — E119 Type 2 diabetes mellitus without complications: Secondary | ICD-10-CM | POA: Diagnosis present

## 2024-01-18 DIAGNOSIS — I10 Essential (primary) hypertension: Secondary | ICD-10-CM | POA: Diagnosis not present

## 2024-01-18 LAB — CBC WITH DIFFERENTIAL/PLATELET
Abs Immature Granulocytes: 0.06 K/uL (ref 0.00–0.07)
Basophils Absolute: 0 K/uL (ref 0.0–0.1)
Basophils Relative: 0 %
Eosinophils Absolute: 0.1 K/uL (ref 0.0–0.5)
Eosinophils Relative: 1 %
HCT: 27.9 % — ABNORMAL LOW (ref 36.0–46.0)
Hemoglobin: 8.8 g/dL — ABNORMAL LOW (ref 12.0–15.0)
Immature Granulocytes: 1 %
Lymphocytes Relative: 9 %
Lymphs Abs: 1 K/uL (ref 0.7–4.0)
MCH: 31.8 pg (ref 26.0–34.0)
MCHC: 31.5 g/dL (ref 30.0–36.0)
MCV: 100.7 fL — ABNORMAL HIGH (ref 80.0–100.0)
Monocytes Absolute: 0.9 K/uL (ref 0.1–1.0)
Monocytes Relative: 8 %
Neutro Abs: 9.1 K/uL — ABNORMAL HIGH (ref 1.7–7.7)
Neutrophils Relative %: 81 %
Platelets: 130 K/uL — ABNORMAL LOW (ref 150–400)
RBC: 2.77 MIL/uL — ABNORMAL LOW (ref 3.87–5.11)
RDW: 16.5 % — ABNORMAL HIGH (ref 11.5–15.5)
WBC: 11.1 K/uL — ABNORMAL HIGH (ref 4.0–10.5)
nRBC: 0 % (ref 0.0–0.2)

## 2024-01-18 LAB — LIPASE, BLOOD: Lipase: 23 U/L (ref 11–51)

## 2024-01-18 LAB — COMPREHENSIVE METABOLIC PANEL WITH GFR
ALT: 18 U/L (ref 0–44)
AST: 23 U/L (ref 15–41)
Albumin: 2.6 g/dL — ABNORMAL LOW (ref 3.5–5.0)
Alkaline Phosphatase: 68 U/L (ref 38–126)
Anion gap: 10 (ref 5–15)
BUN: 14 mg/dL (ref 6–20)
CO2: 24 mmol/L (ref 22–32)
Calcium: 8.5 mg/dL — ABNORMAL LOW (ref 8.9–10.3)
Chloride: 103 mmol/L (ref 98–111)
Creatinine, Ser: 0.93 mg/dL (ref 0.44–1.00)
GFR, Estimated: >60 mL/min (ref >60)
Glucose, Bld: 73 mg/dL (ref 70–99)
Potassium: 3.3 mmol/L — ABNORMAL LOW (ref 3.5–5.1)
Sodium: 137 mmol/L (ref 135–145)
Total Bilirubin: 1.9 mg/dL — ABNORMAL HIGH (ref 0.0–1.2)
Total Protein: 6.4 g/dL — ABNORMAL LOW (ref 6.5–8.1)

## 2024-01-18 LAB — BRAIN NATRIURETIC PEPTIDE: B Natriuretic Peptide: 171 pg/mL — ABNORMAL HIGH (ref 0.0–100.0)

## 2024-01-18 LAB — CBG MONITORING, ED: Glucose-Capillary: 158 mg/dL — ABNORMAL HIGH (ref 70–99)

## 2024-01-18 MED ORDER — VANCOMYCIN HCL 125 MG PO CAPS
125.0000 mg | ORAL_CAPSULE | Freq: Four times a day (QID) | ORAL | Status: DC
Start: 2024-01-18 — End: 2024-01-18
  Filled 2024-01-18 (×3): qty 1

## 2024-01-18 MED ORDER — INSULIN ASPART 100 UNIT/ML IJ SOLN: 0.0000 [IU] | Freq: Every day | INTRAMUSCULAR | Status: DC

## 2024-01-18 MED ORDER — FUROSEMIDE 10 MG/ML IJ SOLN
40.0000 mg | Freq: Once | INTRAMUSCULAR | Status: AC
  Administered 2024-01-18: 40 mg via INTRAVENOUS
  Filled 2024-01-18: qty 4

## 2024-01-18 MED ORDER — ONDANSETRON HCL 4 MG PO TABS: 4.0000 mg | ORAL_TABLET | Freq: Four times a day (QID) | ORAL | Status: DC | PRN

## 2024-01-18 MED ORDER — ENOXAPARIN SODIUM 80 MG/0.8ML IJ SOSY
75.0000 mg | PREFILLED_SYRINGE | INTRAMUSCULAR | Status: DC
Start: 2024-01-18 — End: 2024-01-20
  Filled 2024-01-18: qty 0.8

## 2024-01-18 MED ORDER — INSULIN ASPART 100 UNIT/ML IJ SOLN: 0.0000 [IU] | Freq: Three times a day (TID) | INTRAMUSCULAR | Status: DC

## 2024-01-18 MED ORDER — ONDANSETRON HCL 4 MG/2ML IJ SOLN
4.0000 mg | Freq: Once | INTRAMUSCULAR | Status: AC
  Administered 2024-01-18: 4 mg via INTRAVENOUS
  Filled 2024-01-18: qty 2

## 2024-01-18 MED ORDER — ACETAMINOPHEN 325 MG PO TABS
650.0000 mg | ORAL_TABLET | Freq: Four times a day (QID) | ORAL | Status: DC | PRN
  Administered 2024-01-20 – 2024-01-21 (×2): 650 mg via ORAL
  Filled 2024-01-18 (×2): qty 2

## 2024-01-18 MED ORDER — POTASSIUM CHLORIDE CRYS ER 20 MEQ PO TBCR
40.0000 meq | EXTENDED_RELEASE_TABLET | Freq: Once | ORAL | Status: AC
  Administered 2024-01-18: 40 meq via ORAL
  Filled 2024-01-18: qty 2

## 2024-01-18 MED ORDER — ONDANSETRON HCL 4 MG/2ML IJ SOLN: 4.0000 mg | Freq: Four times a day (QID) | INTRAMUSCULAR | Status: DC | PRN

## 2024-01-18 MED ORDER — FIDAXOMICIN 200 MG PO TABS
200.0000 mg | ORAL_TABLET | Freq: Two times a day (BID) | ORAL | Status: DC
  Administered 2024-01-19 – 2024-01-21 (×5): 200 mg via ORAL
  Filled 2024-01-18 (×8): qty 1

## 2024-01-18 MED ORDER — TRAMADOL HCL 50 MG PO TABS
50.0000 mg | ORAL_TABLET | Freq: Two times a day (BID) | ORAL | Status: DC | PRN
  Administered 2024-01-19 – 2024-01-20 (×3): 50 mg via ORAL
  Filled 2024-01-18 (×3): qty 1

## 2024-01-18 MED ORDER — ACETAMINOPHEN 650 MG RE SUPP: 650.0000 mg | Freq: Four times a day (QID) | RECTAL | Status: DC | PRN

## 2024-01-18 MED ORDER — POLYETHYLENE GLYCOL 3350 17 G PO PACK: 17.0000 g | PACK | Freq: Every day | ORAL | Status: DC | PRN

## 2024-01-18 NOTE — H&P (Addendum)
 History and Physical    Lynn Andrade FMW:[id number] DOB: 1971-02-14 DOA: 01/18/2024  PCP: Samie Frederick, PA-C   Patient coming from: Home  I have personally briefly reviewed patient's old medical records in [facility]  Chief Complaint: Diarrhea, leg swelling  HPI: Lynn Andrade is a 53 y.o. female with medical history significant for CHF, diabetes mellitus, hypertension.  Patient presented to the ED with complaints of vomiting, diarrhea and back pain.   Symptoms started about 3 weeks ago.  She was at [facility] 9/4 for vomiting and diarrhea, she had just completed a course of Macrobid  for UTI at that time.  CTAP W contrast-no acute abnormality.  Showed a hypoattenuating liver focus-short-term MRI follow-up recommended per Care Everywhere. She had stool tests-9/12 that were positive for C. difficile and Shigella.  She was given a 3-day course of azithromycin she has completed, and is on the third day of oral vancomycin .  She reports persistent symptoms of multiple episodes of watery stools, with vomiting.  She reports she has been taking antidiarrheal medications and still having severe diarrhea.  No abdominal pain no fevers no chills. Over the past 3 weeks she has barely taken her Lasix  40 mg twice daily.  In the interim she has had progressive bilateral lower extremity swelling, abdominal bloating, with reported 40 pound weight gain.  ED Course: Temperature 98.5.  Heart rate 79-91.  Respirate rate 18-20.  Blood pressure 105-130.  O2 sats greater than 97% on room air. Potassium 3.3.  WBC 11.1.  BNP elevated at 171. Lasix  40 mg x 1 given.  Review of Systems: As per HPI all other systems reviewed and negative.  Past Medical History:  Diagnosis Date   Acid reflux    Adjustment disorder with depressed mood 10/27/2009   Formatting of this note might be different from the original. Overview:  Adjustment Disorder With Depressed Mood   Chronic congestive heart failure (HCC)    Diabetes  mellitus with coincident hypertension (HCC) 01/13/2022   Diabetes mellitus without complication (HCC)    Edema    Essential hypertension 08/31/2010   Formatting of this note might be different from the original. Hypertension Formatting of this note might be different from the original. Overview:  Hypertension   Fluid retention    Hypertension     Past Surgical History:  Procedure Laterality Date   BILIOPANCREATIC DIVISION W DUODENAL SWITCH     CHOLECYSTECTOMY     DILATION AND CURETTAGE OF UTERUS     TONSILLECTOMY       reports that she has been smoking cigarettes. She has never used smokeless tobacco. She reports current alcohol use. She reports that she does not use drugs.  Allergies  Allergen Reactions   Aspirin-Caffeine Hives and Swelling   Etodolac Hives   Shrimp (Diagnostic) Hives   Toradol [Ketorolac Tromethamine]     unknown    Family History  Problem Relation Age of Onset   Diabetes Mother    Hypertension Mother    Bladder Cancer Mother    Breast cancer Mother    Kidney cancer Mother    Diabetes Father    Colon cancer Neg Hx    Esophageal cancer Neg Hx     Prior to Admission medications   Medication Sig Start Date End Date Taking? Authorizing Provider  Biotin 10 MG CAPS Take 1 capsule by mouth daily.    [provider]  Cholecalciferol  (VITAMIN D3) 125 MCG (5000 UT) CAPS Take by mouth daily.    [provider]  cyanocobalamin  1000 MCG tablet Take 1,000 mcg by mouth daily.    [provider]  dapagliflozin  propanediol (FARXIGA ) 10 MG TABS tablet Take 1 tablet (10 mg total) by mouth daily before breakfast. 02/25/22   Chandrasekhar, Mahesh A, MD  dapagliflozin  propanediol (FARXIGA ) 10 MG TABS tablet TAKE 1 TABLET BY MOUTH ONCE DAILY BEFORE BREAKFAST 05/17/22   Chandrasekhar, Mahesh A, MD  FEROSUL 325 (65 Fe) MG tablet Take 325 mg by mouth 2 (two) times daily. 02/28/21   [provider]  furosemide  (LASIX ) 40 MG tablet Take 40 mg  by mouth 2 (two) times daily. 12/22/21   [provider]  levocetirizine (XYZAL) 5 MG tablet Take 5 mg by mouth every evening.    [provider]  Multiple Vitamins-Minerals (ADEKS PO) Take by mouth daily at 6 (six) AM.    [provider]  omeprazole (PRILOSEC) 10 MG capsule Take 10 mg by mouth daily.    [provider]  sertraline  (ZOLOFT ) 50 MG tablet Take 1 tablet by mouth daily. 01/13/23   [provider]  spironolactone  (ALDACTONE ) 25 MG tablet Take 1 tablet (25 mg total) by mouth daily. 02/25/22   Santo Stanly LABOR, MD  SUPER B COMPLEX/C PO Take by mouth daily.    [provider]  traMADol  (ULTRAM ) 50 MG tablet Take 1 tablet (50 mg total) by mouth every 12 (twelve) hours as needed. 04/13/23   Barbarann Oneil BROCKS, MD  triamcinolone  (NASACORT  ALLERGY 24HR) 55 MCG/ACT AERO nasal inhaler Place 2 sprays into the nose daily.    [provider]  vitamin A 3 MG (10000 UNITS) capsule Take 10,000 Units by mouth daily.    [provider]    Physical Exam: Vitals:   01/18/24 1523 01/18/24 1600 01/18/24 1730 01/18/24 1732  BP: 136/75 111/60 (!) 105/56   Pulse:  83 84 83  Resp:  20 18   Temp:      SpO2:  97% 97% 100%  Weight:      Height:        Constitutional: NAD, calm, comfortable Vitals:   01/18/24 1523 01/18/24 1600 01/18/24 1730 01/18/24 1732  BP: 136/75 111/60 (!) 105/56   Pulse:  83 84 83  Resp:  20 18   Temp:      SpO2:  97% 97% 100%  Weight:      Height:       Eyes: PERRL, lids and conjunctivae normal ENMT: Mucous membranes are moist.  Neck: normal, supple, no masses, no thyromegaly Respiratory: clear to auscultation bilaterally, no wheezing, no crackles. Normal respiratory effort. No accessory muscle use.  Cardiovascular: Regular rate and rhythm, no murmurs / rubs / gallops.  At least 2+ pitting bilateral lower extremity edema to knees, extremities warm..  Abdomen: obese, no tenderness, no masses  palpated. No hepatosplenomegaly.   Musculoskeletal: no clubbing / cyanosis. No joint deformity upper and lower extremities.  Skin: no rashes, lesions, ulcers. No induration Neurologic: No facial asymmetry, moving extremities spontaneously, speech fluent Psychiatric: Normal judgment and insight. Alert and oriented x 3. Normal mood.   Labs on Admission: I have personally reviewed following labs and imaging studies  CBC: Recent Labs  Lab 01/18/24 1344  WBC 11.1*  NEUTROABS 9.1*  HGB 8.8*  HCT 27.9*  MCV 100.7*  PLT 130*   Basic Metabolic Panel: Recent Labs  Lab 01/18/24 1344  NA 137  K 3.3*  CL 103  CO2 24  GLUCOSE 73  BUN 14  CREATININE 0.93  CALCIUM 8.5*   GFR: Estimated Creatinine Clearance: 106.9 mL/min (by C-G formula based on SCr of 0.93 mg/dL). Liver Function Tests: Recent Labs  Lab 01/18/24 1344  AST 23  ALT 18  ALKPHOS 68  BILITOT 1.9*  PROT 6.4*  ALBUMIN  2.6*   Recent Labs  Lab 01/18/24 1344  LIPASE 23   Urine analysis:    Component Value Date/Time   COLORURINE YELLOW 01/25/2022 1633   APPEARANCEUR HAZY (A) 01/25/2022 1633   LABSPEC 1.034 (H) 01/25/2022 1633   PHURINE 5.5 01/25/2022 1633   GLUCOSEU NEGATIVE 01/25/2022 1633   HGBUR NEGATIVE 01/25/2022 1633   BILIRUBINUR SMALL (A) 01/25/2022 1633   BILIRUBINUR small (A) 11/10/2021 1716   KETONESUR NEGATIVE 01/25/2022 1633   PROTEINUR 30 (A) 01/25/2022 1633   UROBILINOGEN 2.0 (A) 11/10/2021 1716   NITRITE NEGATIVE 01/25/2022 1633   LEUKOCYTESUR TRACE (A) 01/25/2022 1633    Radiological Exams on Admission: No results found.  EKG: Pending.   Assessment/Plan Principal Problem:   Vomiting and diarrhea Active Problems:   Acute on chronic diastolic CHF (congestive heart failure) (HCC)   Morbid obesity (HCC)   HTN (hypertension)   DM (diabetes mellitus) (HCC)    Assessment and Plan: * Vomiting and diarrhea Of 3 weeks duration.  No abdominal pain.  Afebrile.  WBC 11.1.  CT AP WC at  [facility] 9/4-no acute abnormality.  Stool was positive for C. difficile toxin, and Shigella.  She has completed a 3-day course of azithromycin, and so far 3 days of oral vancomycin . - 3 days too early to say treatment failure on vancomycin , will use fidaxomycin considering ongoing severity. -Antiemetics  Acute on chronic diastolic CHF (congestive heart failure) (HCC) Presenting with at least 2+ bilateral lower extremity edema, abdominal bloating, and chart confirms about 35 pound weight gain.  BNP elevated at 171 from baseline 20s.  Due to the GI losses, she has held off on taking her Lasix  40 mg twice daily over the past 3 weeks to avoid dehydration.  Last echo 2023 EF of 60 to 65%. - Chest x-ray shows cardiomegaly with pulmonary edema. -IV Lasix  40 mg x 1 given in the ED -Patient would benefit from diuresis, but will hold off at this time with ongoing diarrhea, considering severity -Obtain updated echocardiogram -Strict input output, daily weight, daily BMP  DM (diabetes mellitus) (HCC) - SSI- S - HgbA!c - Hold Farxiga   HTN (hypertension) Systolic 105-130. -Hold Lasix  and spironolactone    Hypokalemia-potassium 3.3.  - Check magnesium  Chronic anemia - Stable. hemoglobin 8.8, recent baseline 8-9.    DVT prophylaxis: Lovenox  Code Status: FULL Code Family Communication: Son at bedside Disposition Plan: ~ 2 days Consults called: None Admission status: Inpt tele I certify that at the point of admission it is my clinical judgment that the patient will require inpatient hospital care spanning beyond 2 midnights from the point of admission due to high intensity of service, high risk for further deterioration and high frequency of surveillance required.  Author: Tully FORBES Carwin, MD 01/18/2024 9:53 PM  For on call review [email or web address].

## 2024-01-18 NOTE — Assessment & Plan Note (Addendum)
 Presenting with at least 2+ bilateral lower extremity edema, abdominal bloating, and chart confirms about 35 pound weight gain.  BNP elevated at 171 from baseline 20s.  Due to the GI losses, she has held off on taking her Lasix  40 mg twice daily over the past 3 weeks to avoid dehydration.  Last echo 2023 EF of 60 to 65%. - Chest x-ray shows cardiomegaly with pulmonary edema. -IV Lasix  40 mg x 1 given in the ED -Patient would benefit from diuresis, but will hold off at this time with ongoing diarrhea, considering severity -Obtain updated echocardiogram -Strict input output, daily weight, daily BMP

## 2024-01-18 NOTE — Assessment & Plan Note (Signed)
 Systolic 105-130. -Hold Lasix  and spironolactone 

## 2024-01-18 NOTE — Progress Notes (Signed)
 PHARMACIST - PHYSICIAN COMMUNICATION  CONCERNING:  Enoxaparin  (Lovenox ) for DVT Prophylaxis    RECOMMENDATION: Patient was prescribed enoxaprin 40mg  q24 hours for VTE prophylaxis.   Filed Weights   01/18/24 1325  Weight: (!) 152 kg (335 lb)    Body mass index is 54.9 kg/m.  Estimated Creatinine Clearance: 106.9 mL/min (by C-G formula based on SCr of 0.93 mg/dL).   Based on [facility] policy patient is candidate for enoxaparin  0.5mg /kg TBW SQ every 24 hours based on BMI being >30.  DESCRIPTION: Pharmacy has adjusted enoxaparin  dose per [facility] policy.  Patient is now receiving enoxaparin  75 mg every 24 hours    Annabella LOISE Banks, PharmD Clinical Pharmacist  01/18/2024 9:17 PM

## 2024-01-18 NOTE — ED Provider Triage Note (Signed)
 Emergency Medicine Provider Triage Evaluation Note  Lynn Andrade , a 54 y.o. female  was evaluated in triage.  Pt complains of nausea vomiting diarrhea x 3 weeks, recently diagnosed with Shigella and C. difficile, started on vancomycin  and another unknown medication she reports that she has taken a 3-day course of and she is not feeling any better and feels very dehydrated..  Review of Systems  Positive: Nausea vomiting diarrhea Negative: Fever  Physical Exam  BP 130/66 (BP Location: Right Arm)   Pulse 79   Temp 98.5 F (36.9 C)   Resp 20   Ht 5' 5.5 (1.664 m)   Wt (!) 152 kg   SpO2 98%   BMI 54.90 kg/m  Gen:   Awake, no distress   Resp:  Normal effort  MSK:   Moves extremities without difficulty  Other:    Medical Decision Making  Medically screening exam initiated at 2:27 PM.  Appropriate orders placed.  Ericah Nola was informed that the remainder of the evaluation will be completed by another provider, this initial triage assessment does not replace that evaluation, and the importance of remaining in the ED until their evaluation is complete.     Suellen Cantor A, PA-C 01/18/24 1428

## 2024-01-18 NOTE — Assessment & Plan Note (Signed)
-   SSI- S - HgbA!c - Hold Farxiga

## 2024-01-18 NOTE — ED Provider Notes (Signed)
 [place] EMERGENCY DEPARTMENT AT [facility] Provider Note   CSN: [id number] Arrival date & time: 01/18/24  1301     Patient presents with: Abdominal Pain and Diarrhea   Lynn Andrade is a 53 y.o. female.  History of GERD, hypertension, diabetes.  Presents the ER today complaining of continued nausea vomiting diarrhea.  This been ongoing for 3 weeks.  She initially was seen at [facility] and had labs and CT scan and was discharged home and her symptoms continued she had a GI panel ordered that came back several days ago for C. difficile and Shigella.  She states she was given an unknown antibiotic for 3 days which she has now finished and has been on oral vancomycin .  She still having frequent watery stools and states she has had a 40 pound weight gain and fluid with severely worsened lower extremity edema over the past several weeks.  States she feels very dehydrated and overall unwell, she continues to have lower back pain that is constant.  Denies injury or trauma, no numbness ting or weakness, no saddle anesthesia or paresthesia, no bowel or bladder incontinence.  She reports she had not taken her Lasix  in about 2 weeks except for yesterday.  She states she felt like she was too dehydrated because of the amount of diarrhea she was having and the decreased p.o. intake.     Abdominal Pain Associated symptoms: diarrhea   Diarrhea Associated symptoms: abdominal pain        Prior to Admission medications   Medication Sig Start Date End Date Taking? Authorizing Provider  Biotin 10 MG CAPS Take 1 capsule by mouth daily.    [provider]  Cholecalciferol  (VITAMIN D3) 125 MCG (5000 UT) CAPS Take by mouth daily.    [provider]  cyanocobalamin  1000 MCG tablet Take 1,000 mcg by mouth daily.    [provider]  dapagliflozin  propanediol (FARXIGA ) 10 MG TABS tablet Take 1 tablet (10 mg total) by mouth daily before breakfast. 02/25/22   Chandrasekhar,  Mahesh A, MD  dapagliflozin  propanediol (FARXIGA ) 10 MG TABS tablet TAKE 1 TABLET BY MOUTH ONCE DAILY BEFORE BREAKFAST 05/17/22   Chandrasekhar, Mahesh A, MD  FEROSUL 325 (65 Fe) MG tablet Take 325 mg by mouth 2 (two) times daily. 02/28/21   [provider]  furosemide  (LASIX ) 40 MG tablet Take 40 mg by mouth 2 (two) times daily. 12/22/21   [provider]  levocetirizine (XYZAL) 5 MG tablet Take 5 mg by mouth every evening.    [provider]  Multiple Vitamins-Minerals (ADEKS PO) Take by mouth daily at 6 (six) AM.    [provider]  omeprazole (PRILOSEC) 10 MG capsule Take 10 mg by mouth daily.    [provider]  sertraline  (ZOLOFT ) 50 MG tablet Take 1 tablet by mouth daily. 01/13/23   [provider]  spironolactone  (ALDACTONE ) 25 MG tablet Take 1 tablet (25 mg total) by mouth daily. 02/25/22   Santo Stanly LABOR, MD  SUPER B COMPLEX/C PO Take by mouth daily.    [provider]  traMADol  (ULTRAM ) 50 MG tablet Take 1 tablet (50 mg total) by mouth every 12 (twelve) hours as needed. 04/13/23   Barbarann Oneil BROCKS, MD  triamcinolone  (NASACORT  ALLERGY 24HR) 55 MCG/ACT AERO nasal inhaler Place 2 sprays into the nose daily.    [provider]  vitamin A 3 MG (10000 UNITS) capsule Take 10,000 Units by mouth daily.    [provider]    Allergies: Aspirin-caffeine, Etodolac, Shrimp (diagnostic), and Toradol [ketorolac tromethamine]    Review of Systems  Gastrointestinal:  Positive for abdominal pain and diarrhea.    Updated Vital Signs BP 130/66 (BP Location: Right Arm)   Pulse 79   Temp 98.5 F (36.9 C)   Resp 20   Ht 5' 5.5 (1.664 m)   Wt (!) 152 kg   SpO2 98%   BMI 54.90 kg/m   Physical Exam Vitals and nursing note reviewed.  Constitutional:      General: She is not in acute distress.    Appearance: She is well-developed.  HENT:     Head: Normocephalic and atraumatic.  Eyes:     Conjunctiva/sclera:  Conjunctivae normal.  Cardiovascular:     Rate and Rhythm: Normal rate and regular rhythm.     Heart sounds: No murmur heard.    Comments: Pitting edema noted to bilateral lower extremities up to lower abdomen Pulmonary:     Effort: Pulmonary effort is normal. No respiratory distress.     Breath sounds: Normal breath sounds.  Abdominal:     Palpations: Abdomen is soft.     Tenderness: There is no abdominal tenderness.  Musculoskeletal:        General: No swelling.     Cervical back: Neck supple.  Skin:    General: Skin is warm and dry.     Capillary Refill: Capillary refill takes less than 2 seconds.  Neurological:     General: No focal deficit present.     Mental Status: She is alert and oriented to person, place, and time.  Psychiatric:        Mood and Affect: Mood normal.     (all labs ordered are listed, but only abnormal results are displayed) Labs Reviewed  CBC WITH DIFFERENTIAL/PLATELET - Abnormal; Notable for the following components:      Result Value   WBC 11.1 (*)    RBC 2.77 (*)    Hemoglobin 8.8 (*)    HCT 27.9 (*)    MCV 100.7 (*)    RDW 16.5 (*)    Platelets 130 (*)    Neutro Abs 9.1 (*)    All other components within normal limits  LIPASE, BLOOD  COMPREHENSIVE METABOLIC PANEL WITH GFR  URINALYSIS, ROUTINE W REFLEX MICROSCOPIC    EKG: None  Radiology: No results found.   Procedures   Medications Ordered in the ED - No data to display                                  Medical Decision Making This patient presents to the ED for concern of nausea vomiting diarrhea, this involves an extensive number of treatment options, and is a complaint that carries with it a high risk of complications and morbidity.  The differential diagnosis includes infectious diarrhea, dehydration, electrolyte disturbance, other   Co morbidities that complicate the patient evaluation :   GERD, obesity, hypertension, diabetes   Additional history obtained:  Additional  history obtained from EMR External records from outside source obtained and reviewed including recent labs and CT scan from [facility] and GI panel showing C. difficile and Shigella   Lab Tests:  I Ordered, and personally interpreted labs.  The pertinent results include: CBC with white blood cell count of 11.1, hemoglobin 8.8, lipase normal, CMP with mild hypokalemia, BNP elevated at 171   Cardiac Monitoring: /  EKG:  The patient was maintained on a cardiac monitor.  I personally viewed and interpreted the cardiac monitored which showed an underlying rhythm of: Sinus rhythm   Consultations Obtained:  I requested consultation with the hospitalist,  and discussed lab and imaging findings as well as pertinent plan - they recommend: Admission   Problem List / ED Course / Critical interventions / Medication management  Patient is having nausea vomiting and diarrhea, diarrhea noted to be due to C. difficile and Shigella which she has been treated for.  She has not been vomiting in the ER but had not been taking her Lasix  due to her decreased p.o. intake and diarrhea and she did not want to get dehydrated.  She reports a 40 pound weight gain and significant swelling in her legs.  She was given IV Lasix  here and only had minimal output she does not feel that she can go home.  Suspect she likely has intravascular volume depletion and extravascular volume overload.  She is insistent that she does not have heart failure though does seem to carry this diagnosis.  Last echo was in 2024.  Discussed admission versus discharge and patient were for admission, discussed with Dr. Pearlean who was agreeable with admission at this time I ordered medication including Zofran  for nausea Reevaluation of the patient after these medicines showed that the patient improved I have reviewed the patients home medicines and have made adjustments as needed       Amount and/or Complexity of Data Reviewed Labs:  ordered.  Risk Prescription drug management. Decision regarding hospitalization.        Final diagnoses:  None    ED Discharge Orders     None          Suellen Sherran DELENA DEVONNA 01/18/24 2331    Suzette Pac, MD 01/19/24 1810

## 2024-01-18 NOTE — ED Triage Notes (Signed)
 Reports just diagnosed with c diff and shigella and usually goes to [place] but they have miss diagnosed her.  Reports back pain vomiting and diarrhea.

## 2024-01-18 NOTE — Assessment & Plan Note (Signed)
 Of 3 weeks duration.  No abdominal pain.  Afebrile.  WBC 11.1.  CT AP WC at [facility] 9/4-no acute abnormality.  Stool was positive for C. difficile toxin, and Shigella.  She has completed a 3-day course of azithromycin, and so far 3 days of oral vancomycin . - 3 days too early to say treatment failure on vancomycin , will use fidaxomycin considering ongoing severity. -Antiemetics

## 2024-01-19 ENCOUNTER — Other Ambulatory Visit (HOSPITAL_COMMUNITY): Payer: Self-pay

## 2024-01-19 ENCOUNTER — Other Ambulatory Visit (HOSPITAL_COMMUNITY): Payer: Self-pay | Admitting: *Deleted

## 2024-01-19 ENCOUNTER — Telehealth (HOSPITAL_COMMUNITY): Payer: Self-pay | Admitting: Pharmacy Technician

## 2024-01-19 ENCOUNTER — Inpatient Hospital Stay (HOSPITAL_COMMUNITY)

## 2024-01-19 ENCOUNTER — Encounter (HOSPITAL_COMMUNITY): Payer: Self-pay | Admitting: Internal Medicine

## 2024-01-19 DIAGNOSIS — R197 Diarrhea, unspecified: Secondary | ICD-10-CM | POA: Diagnosis not present

## 2024-01-19 DIAGNOSIS — I5031 Acute diastolic (congestive) heart failure: Secondary | ICD-10-CM

## 2024-01-19 DIAGNOSIS — R1110 Vomiting, unspecified: Secondary | ICD-10-CM | POA: Diagnosis not present

## 2024-01-19 LAB — ECHOCARDIOGRAM COMPLETE
AR max vel: 1.92 cm2
AV Area VTI: 1.77 cm2
AV Area mean vel: 1.66 cm2
AV Mean grad: 10 mmHg
AV Peak grad: 17.1 mmHg
Ao pk vel: 2.07 m/s
Area-P 1/2: 2.73 cm2
Height: 65.5 in
S' Lateral: 3.8 cm
Weight: 5360 [oz_av]

## 2024-01-19 LAB — BASIC METABOLIC PANEL WITH GFR
Anion gap: 4 — ABNORMAL LOW (ref 5–15)
BUN: 15 mg/dL (ref 6–20)
CO2: 24 mmol/L (ref 22–32)
Calcium: 7.8 mg/dL — ABNORMAL LOW (ref 8.9–10.3)
Chloride: 107 mmol/L (ref 98–111)
Creatinine, Ser: 0.99 mg/dL (ref 0.44–1.00)
GFR, Estimated: >60 mL/min (ref >60)
Glucose, Bld: 80 mg/dL (ref 70–99)
Potassium: 3.9 mmol/L (ref 3.5–5.1)
Sodium: 135 mmol/L (ref 135–145)

## 2024-01-19 LAB — CBC
HCT: 22.3 % — ABNORMAL LOW (ref 36.0–46.0)
Hemoglobin: 7.3 g/dL — ABNORMAL LOW (ref 12.0–15.0)
MCH: 32.4 pg (ref 26.0–34.0)
MCHC: 32.7 g/dL (ref 30.0–36.0)
MCV: 99.1 fL (ref 80.0–100.0)
Platelets: 109 K/uL — ABNORMAL LOW (ref 150–400)
RBC: 2.25 MIL/uL — ABNORMAL LOW (ref 3.87–5.11)
RDW: 16.3 % — ABNORMAL HIGH (ref 11.5–15.5)
WBC: 6.9 K/uL (ref 4.0–10.5)
nRBC: 0 % (ref 0.0–0.2)

## 2024-01-19 LAB — URINALYSIS, ROUTINE W REFLEX MICROSCOPIC
Bilirubin Urine: NEGATIVE
Glucose, UA: NEGATIVE mg/dL
Ketones, ur: NEGATIVE mg/dL
Nitrite: POSITIVE — AB
Protein, ur: NEGATIVE mg/dL
Specific Gravity, Urine: 1.004 — ABNORMAL LOW (ref 1.005–1.030)
pH: 6 (ref 5.0–8.0)

## 2024-01-19 LAB — HEMOGLOBIN AND HEMATOCRIT, BLOOD
HCT: 23.9 % — ABNORMAL LOW (ref 36.0–46.0)
Hemoglobin: 7.8 g/dL — ABNORMAL LOW (ref 12.0–15.0)

## 2024-01-19 LAB — IRON AND TIBC
Iron: 30 ug/dL (ref 28–170)
Saturation Ratios: 10 % — ABNORMAL LOW (ref 10.4–31.8)
TIBC: 289 ug/dL (ref 250–450)
UIBC: 259 ug/dL

## 2024-01-19 LAB — FOLATE: Folate: 4.6 ng/mL — ABNORMAL LOW (ref >5.9)

## 2024-01-19 LAB — GLUCOSE, CAPILLARY
Glucose-Capillary: 96 mg/dL (ref 70–99)
Glucose-Capillary: 78 mg/dL (ref 70–99)

## 2024-01-19 LAB — CBG MONITORING, ED
Glucose-Capillary: 88 mg/dL (ref 70–99)
Glucose-Capillary: 68 mg/dL — ABNORMAL LOW (ref 70–99)
Glucose-Capillary: 105 mg/dL — ABNORMAL HIGH (ref 70–99)

## 2024-01-19 LAB — HIV ANTIBODY (ROUTINE TESTING W REFLEX): HIV Screen 4th Generation wRfx: NONREACTIVE

## 2024-01-19 LAB — VITAMIN B12: Vitamin B-12: 743 pg/mL (ref 180–914)

## 2024-01-19 MED ORDER — FERROUS SULFATE 325 (65 FE) MG PO TABS
325.0000 mg | ORAL_TABLET | Freq: Two times a day (BID) | ORAL | Status: DC
  Administered 2024-01-19 – 2024-01-21 (×5): 325 mg via ORAL
  Filled 2024-01-19 (×5): qty 1

## 2024-01-19 MED ORDER — ALBUTEROL SULFATE (2.5 MG/3ML) 0.083% IN NEBU: 2.5000 mg | INHALATION_SOLUTION | RESPIRATORY_TRACT | Status: DC | PRN

## 2024-01-19 MED ORDER — ALBUTEROL SULFATE HFA 108 (90 BASE) MCG/ACT IN AERS
2.0000 | INHALATION_SPRAY | RESPIRATORY_TRACT | Status: DC | PRN
  Administered 2024-01-20 – 2024-01-21 (×2): 2 via RESPIRATORY_TRACT

## 2024-01-19 MED ORDER — LEVOTHYROXINE SODIUM 25 MCG PO TABS
25.0000 ug | ORAL_TABLET | Freq: Every day | ORAL | Status: DC
  Administered 2024-01-20 – 2024-01-21 (×2): 25 ug via ORAL
  Filled 2024-01-19 (×2): qty 1

## 2024-01-19 MED ORDER — CHOLECALCIFEROL 1.25 MG (50000 UT) PO CAPS: 50000.0000 [IU] | ORAL_CAPSULE | ORAL | Status: DC

## 2024-01-19 MED ORDER — PANTOPRAZOLE SODIUM 40 MG PO TBEC
40.0000 mg | DELAYED_RELEASE_TABLET | Freq: Every day | ORAL | Status: DC
  Administered 2024-01-19 – 2024-01-20 (×2): 40 mg via ORAL
  Filled 2024-01-19 (×2): qty 1

## 2024-01-19 MED ORDER — TRIAMCINOLONE ACETONIDE 55 MCG/ACT NA AERO
2.0000 | INHALATION_SPRAY | Freq: Every day | NASAL | Status: DC
  Filled 2024-01-19: qty 10.8

## 2024-01-19 MED ORDER — FLORANEX PO PACK
1.0000 g | PACK | Freq: Three times a day (TID) | ORAL | Status: DC
  Administered 2024-01-19 – 2024-01-21 (×6): 1 g via ORAL
  Filled 2024-01-19 (×9): qty 1

## 2024-01-19 MED ORDER — ALBUTEROL SULFATE HFA 108 (90 BASE) MCG/ACT IN AERS
INHALATION_SPRAY | RESPIRATORY_TRACT | Status: AC
  Filled 2024-01-19: qty 6.7

## 2024-01-19 MED ORDER — SERTRALINE HCL 50 MG PO TABS
50.0000 mg | ORAL_TABLET | Freq: Every day | ORAL | Status: DC
  Administered 2024-01-19 – 2024-01-21 (×3): 50 mg via ORAL
  Filled 2024-01-19 (×3): qty 1

## 2024-01-19 MED ORDER — TRIAMCINOLONE ACETONIDE 55 MCG/ACT NA AERO
2.0000 | INHALATION_SPRAY | Freq: Every day | NASAL | Status: DC
Start: 2024-01-20 — End: 2024-01-20
  Filled 2024-01-19: qty 10.8

## 2024-01-19 MED ORDER — VITAMIN D (ERGOCALCIFEROL) 1.25 MG (50000 UNIT) PO CAPS
50000.0000 [IU] | ORAL_CAPSULE | ORAL | Status: DC
  Administered 2024-01-19: 50000 [IU] via ORAL
  Filled 2024-01-19: qty 1

## 2024-01-19 NOTE — Progress Notes (Signed)
   01/19/24 1738  TOC Brief Assessment  Insurance and Status Reviewed  Patient has primary care physician Yes  Home environment has been reviewed From home  Prior level of function: Independent  Prior/Current Home Services No current home services  Social Drivers of Health Review SDOH reviewed interventions complete  Readmission risk has been reviewed Yes  Transition of care needs no transition of care needs at this time   Smoking cessation added to AVS.  Transition of Care Department (TOC) has reviewed patient and no TOC needs have been identified at this time. We will continue to monitor patient advancement through interdisciplinary progression rounds. If new patient transition needs arise, please place a TOC consult.

## 2024-01-19 NOTE — Telephone Encounter (Signed)
 Pharmacy Patient Advocate Encounter   Received notification from Inpatient Request that prior authorization for Dificid  200mg  tablets is required/requested.   Insurance verification completed.   The patient is insured through [employer] .   Per test claim: PA required; PA submitted to above mentioned insurance via Latent Key/confirmation #/EOC [id number] Status is pending

## 2024-01-19 NOTE — Telephone Encounter (Signed)
 Pharmacy Patient Advocate Encounter  Received notification from [employer] that Prior Authorization for Dificid  200mg  tablets has been APPROVED from 01/19/2024 to 01/18/2025.   PA #/Case ID/Reference #: [id number]

## 2024-01-19 NOTE — Plan of Care (Signed)

## 2024-01-19 NOTE — Hospital Course (Signed)
 Lynn Andrade is a 53 y.o. female with medical history significant for CHF, diabetes mellitus, hypertension.  Patient presented to the ED with complaints of vomiting, diarrhea and back pain.   Symptoms started about 3 weeks ago.  She was at [facility] 9/4 for vomiting and diarrhea, she had just completed a course of Macrobid  for UTI at that time.  CTAP W contrast-no acute abnormality.  Showed a hypoattenuating liver focus-short-term MRI follow-up recommended per Care Everywhere. She had stool tests-9/12 that were positive for C. difficile and Shigella.    She was given a 3-day course of azithromycin she has completed, and is on the third day of oral vancomycin .  She reports persistent symptoms of multiple episodes of watery stools, with vomiting.  She reports she has been taking antidiarrheal medications and still having severe diarrhea.  No abdominal pain no fevers no chills. Over the past 3 weeks she has barely taken her Lasix  40 mg twice daily.  In the interim she has had progressive bilateral lower extremity swelling, abdominal bloating, with reported 40 pound weight gain.   ED Course: Temperature 98.5.  Heart rate 79-91.  Respirate rate 18-20.  Blood pressure 105-130.  O2 sats greater than 97% on room air. Potassium 3.3.  WBC 11.1.  BNP elevated at 171. Lasix  40 mg x 1 given.

## 2024-01-19 NOTE — Telephone Encounter (Signed)
 Pharmacy Patient Advocate Encounter  Insurance verification completed.    The patient is insured through [employer].     Ran test claim for Dificid  200mg  tablets and the medication requires a prior authorization.  Plan prefers the following.      This test claim was processed through [facility]- copay amounts may vary at other pharmacies due to pharmacy/plan contracts, or as the patient moves through the different stages of their insurance plan.

## 2024-01-19 NOTE — ED Notes (Signed)
 Pt had urine sample on counter from yesterday. Advised needing an new one. Pt agreed

## 2024-01-19 NOTE — Progress Notes (Signed)
*  PRELIMINARY RESULTS* Echocardiogram 2D Echocardiogram has been performed.  Lynn Andrade 01/19/2024, 2:28 PM

## 2024-01-19 NOTE — Progress Notes (Addendum)
 PROGRESS NOTE    Patient: Lynn Andrade                            PCP: Samie Frederick, PA-C                    DOB: [birth year]            DOA: 01/18/2024 FMW:[id number]             DOS: 01/19/2024, 10:51 AM   LOS: 1 day   Date of Service: The patient was seen and examined on 01/19/2024  Subjective:   The patient was seen and examined this morning. Hemodynamically stable, satting 96% on room air Reporting 1 diarrhea episodes  Brief Narrative:   Jazzma Neidhardt is a 53 y.o. female with medical history significant for CHF, diabetes mellitus, hypertension.  Patient presented to the ED with complaints of vomiting, diarrhea and back pain.   Symptoms started about 3 weeks ago.  She was at [facility] 9/4 for vomiting and diarrhea, she had just completed a course of Macrobid  for UTI at that time.  CTAP W contrast-no acute abnormality.  Showed a hypoattenuating liver focus-short-term MRI follow-up recommended per Care Everywhere. She had stool tests-9/12 that were positive for C. difficile and Shigella.    She was given a 3-day course of azithromycin she has completed, and is on the third day of oral vancomycin .  She reports persistent symptoms of multiple episodes of watery stools, with vomiting.  She reports she has been taking antidiarrheal medications and still having severe diarrhea.  No abdominal pain no fevers no chills. Over the past 3 weeks she has barely taken her Lasix  40 mg twice daily.  In the interim she has had progressive bilateral lower extremity swelling, abdominal bloating, with reported 40 pound weight gain.   ED Course: Temperature 98.5.  Heart rate 79-91.  Respirate rate 18-20.  Blood pressure 105-130.  O2 sats greater than 97% on room air. Potassium 3.3.  WBC 11.1.  BNP elevated at 171. Lasix  40 mg x 1 given.    Assessment & Plan:   Principal Problem:   Vomiting and diarrhea Active Problems:   Acute on chronic diastolic CHF (congestive heart failure) (HCC)   Morbid obesity  (HCC)   HTN (hypertension)   DM (diabetes mellitus) (HCC)     Assessment and Plan: * Vomiting and diarrhea -Nausea vomiting diarrhea x 3 weeks - Associated with dehydration -- Recent imaging of abdomen pelvis abnormal on 9 /4 revealed no acute abnormalities - Stool for C. difficile toxin and Shigella positive - Completed 3 days of azithromycin, s/p PE 3 days of p.o. vancomycin  - Patient now started on Fidaxomycin considering ongoing severity. - As needed antiemetics  Acute on chronic diastolic CHF (congestive heart failure) (HCC) -Based on last echocardiogram, preserved ejection fraction, grade 1 diastolic dysfunction -BNP 171>> - +3 pitting edema lower extremities, abdominal bloating -On p.o. diuretics at home despite taking diuretics still gaining weight per patient 35 pounds past couple weeks-believe that his fluids  -Due to ongoing diarrhea holding diuretics -Lasix  40 mg IV x 1 was given in ED   Due to the GI losses, she has held off on taking her Lasix  40 mg twice daily over the past 3 weeks to avoid dehydration.   Last echo 2023 EF of 60 to 65%. - Chest x-ray shows cardiomegaly with pulmonary edema.  -Obtain updated echocardiogram -Strict input output, daily weight,  daily BMP  DM (diabetes mellitus) (HCC) - SSI- S - HgbA!c - Hold Farxiga   Acute on chronic anemia-anemia of chronic disease  - Obtaining iron studies - Monitoring H&H closely   HTN (hypertension) Systolic 105-130. -Hold Lasix  and spironolactone    --------------------------------------------------------------------------------------------------------------------- Nutritional status:  The patient's BMI is: Body mass index is 54.9 kg/m. I agree with the assessment and plan as outlined  Nutrition Status:       Skin Assessment: I have examined the patient's skin and I agree with the wound assessment as performed by wound care team As outlined belowe:    --------------------------------------------------------------------------------------------------------------------  DVT prophylaxis:     Code Status:   Code Status: Full Code  Family Communication: No family member present at bedside-  -Advance care planning has been discussed.   Admission status:   Status is: Inpatient Remains inpatient appropriate because: Needing antibiotics, monitoring for dehydration IV fluids  Disposition: From  - home             Planning for discharge in 1-2 days   Procedures:   No admission procedures for hospital encounter.   Antimicrobials:  Anti-infectives (From admission, onward)    Start     Dose/Rate Route Frequency Ordered Stop   01/18/24 2200  vancomycin  (VANCOCIN ) capsule 125 mg  Status:  Discontinued        125 mg Oral 4 times daily 01/18/24 2112 01/18/24 2149   01/18/24 2200  fidaxomicin  (DIFICID ) tablet 200 mg        200 mg Oral 2 times daily 01/18/24 2149 01/28/24 2159        Medication:   enoxaparin  (LOVENOX ) injection  75 mg Subcutaneous Q24H   fidaxomicin   200 mg Oral BID   insulin  aspart  0-5 Units Subcutaneous QHS   insulin  aspart  0-9 Units Subcutaneous TID WC   lactobacillus  1 g Oral TID WC    acetaminophen  **OR** acetaminophen , ondansetron  **OR** ondansetron  (ZOFRAN ) IV, polyethylene glycol, traMADol    Objective:   Vitals:   01/18/24 1930 01/19/24 0100 01/19/24 0400 01/19/24 0745  BP: 118/66 124/89 118/74 (!) 118/54  Pulse: 82 75 75 72  Resp: 16 20 20  (!) 21  Temp: 98.2 F (36.8 C)   98.3 F (36.8 C)  TempSrc: Oral   Oral  SpO2: 100% 99% 94% 96%  Weight:      Height:       No intake or output data in the 24 hours ending 01/19/24 1051 Filed Weights   01/18/24 1325  Weight: (!) 152 kg     Physical examination:   General:  AAO x 3,  cooperative, no distress;   HEENT:  Normocephalic, PERRL, otherwise with in Normal limits   Neuro:  CNII-XII intact. , normal motor and sensation, reflexes intact    Lungs:   Clear to auscultation BL, Respirations unlabored,  No wheezes / crackles  Cardio:    S1/S2, RRR, No murmure, No Rubs or Gallops   Abdomen:  Obese, soft, non-tender, bowel sounds active all four quadrants, no guarding or peritoneal signs.  Muscular  skeletal:  Limited exam -global generalized weaknesses - in bed, able to move all 4 extremities,   2+ pulses,  symmetric, +3  pitting edema  Skin:  Dry, warm to touch, negative for any Rashes,  Wounds: Please see nursing documentation    ---------------------------------------------------------------------------------------------------------------------------    LABs:     Latest Ref Rng & Units 01/19/2024    8:00 AM 01/19/2024    3:40 AM 01/18/2024  1:44 PM  CBC  WBC 4.0 - 10.5 K/uL  6.9  11.1   Hemoglobin 12.0 - 15.0 g/dL 7.8  7.3  8.8   Hematocrit 36.0 - 46.0 % 23.9  22.3  27.9   Platelets 150 - 400 K/uL  109  130       Latest Ref Rng & Units 01/19/2024    3:40 AM 01/18/2024    1:44 PM 01/27/2022    7:58 AM  CMP  Glucose 70 - 99 mg/dL 80  73  898   BUN 6 - 20 mg/dL 15  14  17    Creatinine 0.44 - 1.00 mg/dL 9.00  9.06  9.09   Sodium 135 - 145 mmol/L 135  137  139   Potassium 3.5 - 5.1 mmol/L 3.9  3.3  3.9   Chloride 98 - 111 mmol/L 107  103  109   CO2 22 - 32 mmol/L 24  24  26    Calcium 8.9 - 10.3 mg/dL 7.8  8.5  8.7   Total Protein 6.5 - 8.1 g/dL  6.4  5.8   Total Bilirubin 0.0 - 1.2 mg/dL  1.9  1.3   Alkaline Phos 38 - 126 U/L  68  60   AST 15 - 41 U/L  23  29   ALT 0 - 44 U/L  18  25        Micro Results No results found for this or any previous visit (from the past 240 hours).  Radiology Reports DG CHEST PORT 1 VIEW Result Date: 01/18/2024 CLINICAL DATA:  Leg swelling. Lower extremity edema. History of CHF. EXAM: PORTABLE CHEST 1 VIEW COMPARISON:  01/25/2022 FINDINGS: The heart is enlarged. Diffuse interstitial thickening likely pulmonary edema. No significant pleural effusion. No focal airspace disease.  No pneumothorax. Thoracic spondylosis. IMPRESSION: Cardiomegaly with pulmonary edema. Electronically Signed   By: Andrea Gasman M.D.   On: 01/18/2024 20:41    SIGNED: Adriana DELENA Grams, MD, FHM. FAAFP. Jolynn Pack - [facility] Time spent - 55 min.  In seeing, evaluating and examining the patient. Reviewing medical records, labs, drawn plan of care. [facility],  Pager (please use [email or web address] to page/ text) Please use Epic Secure Chat for non-urgent communication (7AM-7PM)  If 7PM-7AM, please contact night-coverage [email or web address], 01/19/2024, 10:51 AM

## 2024-01-20 DIAGNOSIS — R1110 Vomiting, unspecified: Secondary | ICD-10-CM | POA: Diagnosis not present

## 2024-01-20 DIAGNOSIS — R197 Diarrhea, unspecified: Secondary | ICD-10-CM | POA: Diagnosis not present

## 2024-01-20 LAB — BASIC METABOLIC PANEL WITH GFR
Anion gap: 4 — ABNORMAL LOW (ref 5–15)
BUN: 15 mg/dL (ref 6–20)
CO2: 27 mmol/L (ref 22–32)
Calcium: 7.9 mg/dL — ABNORMAL LOW (ref 8.9–10.3)
Chloride: 104 mmol/L (ref 98–111)
Creatinine, Ser: 0.89 mg/dL (ref 0.44–1.00)
GFR, Estimated: >60 mL/min (ref >60)
Glucose, Bld: 78 mg/dL (ref 70–99)
Potassium: 4 mmol/L (ref 3.5–5.1)
Sodium: 135 mmol/L (ref 135–145)

## 2024-01-20 LAB — GLUCOSE, CAPILLARY
Glucose-Capillary: 68 mg/dL — ABNORMAL LOW (ref 70–99)
Glucose-Capillary: 77 mg/dL (ref 70–99)
Glucose-Capillary: 71 mg/dL (ref 70–99)
Glucose-Capillary: 76 mg/dL (ref 70–99)
Glucose-Capillary: 83 mg/dL (ref 70–99)

## 2024-01-20 LAB — CBC
HCT: 25.2 % — ABNORMAL LOW (ref 36.0–46.0)
Hemoglobin: 8 g/dL — ABNORMAL LOW (ref 12.0–15.0)
MCH: 32 pg (ref 26.0–34.0)
MCHC: 31.7 g/dL (ref 30.0–36.0)
MCV: 100.8 fL — ABNORMAL HIGH (ref 80.0–100.0)
Platelets: 127 K/uL — ABNORMAL LOW (ref 150–400)
RBC: 2.5 MIL/uL — ABNORMAL LOW (ref 3.87–5.11)
RDW: 16.3 % — ABNORMAL HIGH (ref 11.5–15.5)
WBC: 6.5 K/uL (ref 4.0–10.5)
nRBC: 0 % (ref 0.0–0.2)

## 2024-01-20 LAB — HEMOGLOBIN A1C
Hgb A1c MFr Bld: <4.2 % — ABNORMAL LOW (ref 4.8–5.6)
Mean Plasma Glucose: <74 mg/dL

## 2024-01-20 LAB — BRAIN NATRIURETIC PEPTIDE: B Natriuretic Peptide: 162 pg/mL — ABNORMAL HIGH (ref 0.0–100.0)

## 2024-01-20 MED ORDER — ALBUMIN HUMAN 25 % IV SOLN
25.0000 g | Freq: Once | INTRAVENOUS | Status: AC
  Administered 2024-01-20: 25 g via INTRAVENOUS
  Filled 2024-01-20: qty 100

## 2024-01-20 MED ORDER — METOLAZONE 5 MG PO TABS
5.0000 mg | ORAL_TABLET | Freq: Every day | ORAL | Status: DC
  Administered 2024-01-20 – 2024-01-21 (×2): 5 mg via ORAL
  Filled 2024-01-20 (×2): qty 1

## 2024-01-20 MED ORDER — FLORANEX PO PACK: 1.0000 g | PACK | Freq: Three times a day (TID) | ORAL | 0 refills | Status: AC

## 2024-01-20 MED ORDER — FOLIC ACID 1 MG PO TABS
1.0000 mg | ORAL_TABLET | Freq: Every day | ORAL | Status: DC
  Administered 2024-01-20 – 2024-01-21 (×2): 1 mg via ORAL
  Filled 2024-01-20 (×2): qty 1

## 2024-01-20 MED ORDER — FUROSEMIDE 10 MG/ML IJ SOLN
40.0000 mg | Freq: Once | INTRAMUSCULAR | Status: AC
  Administered 2024-01-20: 40 mg via INTRAVENOUS
  Filled 2024-01-20: qty 4

## 2024-01-20 MED ORDER — NYSTATIN 100000 UNIT/GM EX POWD
Freq: Three times a day (TID) | CUTANEOUS | Status: DC
  Administered 2024-01-21: 1 via TOPICAL
  Filled 2024-01-20: qty 15

## 2024-01-20 MED ORDER — FLUTICASONE PROPIONATE 50 MCG/ACT NA SUSP
2.0000 | Freq: Every day | NASAL | Status: DC
  Administered 2024-01-20 – 2024-01-21 (×2): 2 via NASAL
  Filled 2024-01-20: qty 16

## 2024-01-20 MED ORDER — ENOXAPARIN SODIUM 80 MG/0.8ML IJ SOSY
80.0000 mg | PREFILLED_SYRINGE | INTRAMUSCULAR | Status: DC
  Administered 2024-01-20: 80 mg via SUBCUTANEOUS
  Filled 2024-01-20: qty 0.8

## 2024-01-20 MED ORDER — FIDAXOMICIN 200 MG PO TABS: 200.0000 mg | ORAL_TABLET | Freq: Two times a day (BID) | ORAL | 0 refills | Status: AC

## 2024-01-20 NOTE — Plan of Care (Signed)

## 2024-01-20 NOTE — Progress Notes (Signed)
 PROGRESS NOTE    Patient: Lynn Andrade                            PCP: Samie Frederick, PA-C                    DOB: April 26, 1971            DOA: 01/18/2024 FMW:[id number]             DOS: 01/20/2024, 11:26 AM   LOS: 2 days   Date of Service: The patient was seen and examined on 01/20/2024  Subjective:   The patient was seen and examined this morning, stable no acute distress, hemodynamically stable Reporting stooling has improved, only 4 episode in past 24 hours, starting to form   Brief Narrative:   Lynn Andrade is a 53 y.o. female with medical history significant for CHF, diabetes mellitus, hypertension.  Patient presented to the ED with complaints of vomiting, diarrhea and back pain.   Symptoms started about 3 weeks ago.  She was at [facility] 9/4 for vomiting and diarrhea, she had just completed a course of Macrobid  for UTI at that time.  CTAP W contrast-no acute abnormality.  Showed a hypoattenuating liver focus-short-term MRI follow-up recommended per Care Everywhere. She had stool tests-9/12 that were positive for C. difficile and Shigella.    She was given a 3-day course of azithromycin she has completed, and is on the third day of oral vancomycin .  She reports persistent symptoms of multiple episodes of watery stools, with vomiting.  She reports she has been taking antidiarrheal medications and still having severe diarrhea.  No abdominal pain no fevers no chills. Over the past 3 weeks she has barely taken her Lasix  40 mg twice daily.  In the interim she has had progressive bilateral lower extremity swelling, abdominal bloating, with reported 40 pound weight gain.   ED Course: Temperature 98.5.  Heart rate 79-91.  Respirate rate 18-20.  Blood pressure 105-130.  O2 sats greater than 97% on room air. Potassium 3.3.  WBC 11.1.  BNP elevated at 171. Lasix  40 mg x 1 given.    Assessment & Plan:   Principal Problem:   Vomiting and diarrhea Active Problems:   Acute on chronic diastolic  CHF (congestive heart failure) (HCC)   Morbid obesity (HCC)   HTN (hypertension)   DM (diabetes mellitus) (HCC)     Assessment and Plan: *C. difficile colitis  - with nausea, vomiting and diarrhea -Nausea vomiting diarrhea x 3 weeks -Nausea vomiting has improved, tolerating p.o. -Diarrhea improving only 4 episodes past 24 hours per patient, forming  -Mild dehydration improving, holding diuretics  -- Recent imaging of abdomen pelvis abnormal on 9 /4 revealed no acute abnormalities - Stool for C. difficile toxin and Shigella positive - Completed 3 days of azithromycin, s/p PE 3 days of p.o. vancomycin  - Patient now started on Fidaxomycin considering ongoing severity. - Continue as needed antiemetics - Gentle oral hydration  ? chronic diastolic CHF (congestive heart failure) (HCC)  -associated with extensive lower extremity edema -Based on last echocardiogram, preserved ejection fraction, grade 1 diastolic dysfunction -BNP 171>> 162.0 - +3 pitting edema lower extremities, abdominal bloating -On p.o. diuretics at home despite taking diuretics still gaining weight per patient 35 pounds past couple weeks-believe that his fluids  -Due to ongoing diarrhea holding diuretics -Lasix  40 mg IV x 1 was given in ED, considering giving another dose  Due to the GI losses, she has held off on taking her Lasix  40 mg twice daily over the past 3 weeks to avoid dehydration.   Last echo 2023 EF of 60 to 65%. -Repeat echo >> ejection fraction 60-65%, no LVH, normal LV, RV compromise or hypertrophy No signs of heart failure on echocardiogram - Chest x-ray shows cardiomegaly with pulmonary edema.  -Obtain updated echocardiogram -Strict input output, daily weight, daily BMP  DM (diabetes mellitus) (HCC) -Blood sugars: 158, 78, 71 - SSI- S - HgbA1c  - Hold Farxiga   Acute on chronic anemia-anemia of chronic disease  - Obtaining iron studies : Total iron 30  within normal limits, folate low at  4.6, B12 normal at 743 - Monitoring H&H closely - Adding folate supplement   HTN (hypertension) Systolic 105-130. -Hold Lasix  and spironolactone    --------------------------------------------------------------------------------------------------------------------- Nutritional status:  The patient's BMI is: Body mass index is 57.84 kg/m. I agree with the assessment and plan as outlined  Nutrition Status:       Skin Assessment: I have examined the patient's skin and I agree with the wound assessment as performed by wound care team As outlined belowe:   --------------------------------------------------------------------------------------------------------------------  DVT prophylaxis:  Lovenox    Code Status:   Code Status: Full Code  Family Communication: No family member present at bedside-  -Advance care planning has been discussed.   Admission status:   Status is: Inpatient Remains inpatient appropriate because: Needing antibiotics, monitoring for dehydration IV fluids  Disposition: From  - home             Planning for discharge in 1-2 days   Procedures:   No admission procedures for hospital encounter.   Antimicrobials:  Anti-infectives (From admission, onward)    Start     Dose/Rate Route Frequency Ordered Stop   01/20/24 0000  fidaxomicin  (DIFICID ) 200 MG TABS tablet        200 mg Oral 2 times daily 01/20/24 1124 01/30/24 2359   01/18/24 2200  vancomycin  (VANCOCIN ) capsule 125 mg  Status:  Discontinued        125 mg Oral 4 times daily 01/18/24 2112 01/18/24 2149   01/18/24 2200  fidaxomicin  (DIFICID ) tablet 200 mg        200 mg Oral 2 times daily 01/18/24 2149 01/28/24 2159        Medication:   enoxaparin  (LOVENOX ) injection  80 mg Subcutaneous Q24H   ferrous sulfate   325 mg Oral BID   fidaxomicin   200 mg Oral BID   fluticasone   2 spray Each Nare Daily   insulin  aspart  0-5 Units Subcutaneous QHS   insulin  aspart  0-9 Units Subcutaneous TID WC    lactobacillus  1 g Oral TID WC   levothyroxine   25 mcg Oral Daily   metolazone   5 mg Oral Daily   sertraline   50 mg Oral Daily   Vitamin D  (Ergocalciferol )  50,000 Units Oral Q7 days    acetaminophen  **OR** acetaminophen , albuterol , ondansetron  **OR** ondansetron  (ZOFRAN ) IV, traMADol    Objective:   Vitals:   01/19/24 1727 01/19/24 2124 01/20/24 0452 01/20/24 0939  BP: (!) 111/51 (!) 123/58 (!) 115/56 (!) 112/57  Pulse: 74 77 69 74  Resp:  19 17   Temp: 98.5 F (36.9 C) 97.9 F (36.6 C) (!) 97.5 F (36.4 C)   TempSrc: Oral     SpO2: 98% 97% 98% 97%  Weight:      Height:        Intake/Output Summary (  Last 24 hours) at 01/20/2024 1126 Last data filed at 01/20/2024 1003 Gross per 24 hour  Intake 840 ml  Output --  Net 840 ml   Filed Weights   01/18/24 1325 01/19/24 1336  Weight: (!) 152 kg (!) 160.1 kg     Physical examination:    General:  AAO x 3,  cooperative, no distress;   HEENT:  Normocephalic, PERRL, otherwise with in Normal limits   Neuro:  CNII-XII intact. , normal motor and sensation, reflexes intact   Lungs:   Clear to auscultation BL, Respirations unlabored,  No wheezes / crackles  Cardio:    S1/S2, RRR, No murmure, No Rubs or Gallops   Abdomen:  Soft, non-tender, bowel sounds active all four quadrants, no guarding or peritoneal signs.  Muscular  skeletal:  Limited exam -global generalized weaknesses - in bed, able to move all 4 extremities,   2+ pulses,  symmetric, +3  pitting edema/ feet   Skin:  Dry, warm to touch, negative for any Rashes,  Wounds: Please see nursing documentation    ---------------------------------------------------------------------------------------------------------------------------    LABs:     Latest Ref Rng & Units 01/20/2024    7:25 AM 01/19/2024    8:00 AM 01/19/2024    3:40 AM  CBC  WBC 4.0 - 10.5 K/uL 6.5   6.9   Hemoglobin 12.0 - 15.0 g/dL 8.0  7.8  7.3   Hematocrit 36.0 - 46.0 % 25.2  23.9  22.3    Platelets 150 - 400 K/uL 127   109       Latest Ref Rng & Units 01/20/2024    3:54 AM 01/19/2024    3:40 AM 01/18/2024    1:44 PM  CMP  Glucose 70 - 99 mg/dL 78  80  73   BUN 6 - 20 mg/dL 15  15  14    Creatinine 0.44 - 1.00 mg/dL 9.10  9.00  9.06   Sodium 135 - 145 mmol/L 135  135  137   Potassium 3.5 - 5.1 mmol/L 4.0  3.9  3.3   Chloride 98 - 111 mmol/L 104  107  103   CO2 22 - 32 mmol/L 27  24  24    Calcium 8.9 - 10.3 mg/dL 7.9  7.8  8.5   Total Protein 6.5 - 8.1 g/dL   6.4   Total Bilirubin 0.0 - 1.2 mg/dL   1.9   Alkaline Phos 38 - 126 U/L   68   AST 15 - 41 U/L   23   ALT 0 - 44 U/L   18        Micro Results No results found for this or any previous visit (from the past 240 hours).  Radiology Reports ECHOCARDIOGRAM COMPLETE Result Date: 01/19/2024    ECHOCARDIOGRAM REPORT   Patient Name:   AYAKA ANDES Date of Exam: 01/19/2024 Medical Rec #:  [id number]      Height:       65.5 in Accession #:    [id number]     Weight:       335.0 lb Date of Birth:  [birth year]      BSA:          2.477 m Patient Age:    53 years       BP:           118/54 mmHg Patient Gender: F              HR:  72 bpm. Exam Location:  [facility] Procedure: 2D Echo, Cardiac Doppler, Color Doppler and Strain Analysis (Both            Spectral and Color Flow Doppler were utilized during procedure). Indications:    CHF-Acute Diastolic I50.31  History:        Patient has prior history of Echocardiogram examinations, most                 recent 01/26/2022. Signs/Symptoms:Murmur; Risk Factors:Sleep                 Apnea, Dyslipidemia, Diabetes and Hypertension.  Sonographer:    Aida Pizza RCS Referring Phys: [phone] Deshanta Lady A Gilda Abboud  Sonographer Comments: Global longitudinal strain was attempted. IMPRESSIONS  1. Left ventricular ejection fraction, by estimation, is 60 to 65%. The left ventricle has normal function. The left ventricle has no regional wall motion abnormalities. Left ventricular diastolic  parameters were normal. The average left ventricular global longitudinal strain is -16.3 %. The global longitudinal strain is normal.  2. Right ventricular systolic function is normal. The right ventricular size is normal.  3. The mitral valve is normal in structure. Trivial mitral valve regurgitation. No evidence of mitral stenosis.  4. The tricuspid valve is abnormal.  5. The aortic valve is tricuspid. Aortic valve regurgitation is not visualized. No aortic stenosis is present. Aortic valve area, by VTI measures 1.77 cm. Aortic valve mean gradient measures 10.0 mmHg.  6. The inferior vena cava is normal in size with greater than 50% respiratory variability, suggesting right atrial pressure of 3 mmHg. FINDINGS  Left Ventricle: Left ventricular ejection fraction, by estimation, is 60 to 65%. The left ventricle has normal function. The left ventricle has no regional wall motion abnormalities. The average left ventricular global longitudinal strain is -16.3 %. Strain was performed and the global longitudinal strain is normal. The left ventricular internal cavity size was normal in size. There is no left ventricular hypertrophy. Left ventricular diastolic parameters were normal. Right Ventricle: The right ventricular size is normal. Right vetricular wall thickness was not well visualized. Right ventricular systolic function is normal. Left Atrium: Left atrial size was normal in size. Right Atrium: Right atrial size was normal in size. Pericardium: There is no evidence of pericardial effusion. Mitral Valve: The mitral valve is normal in structure. Trivial mitral valve regurgitation. No evidence of mitral valve stenosis. Tricuspid Valve: The tricuspid valve is abnormal. Tricuspid valve regurgitation is mild . No evidence of tricuspid stenosis. Aortic Valve: The aortic valve is tricuspid. Aortic valve regurgitation is not visualized. No aortic stenosis is present. Aortic valve mean gradient measures 10.0 mmHg. Aortic  valve peak gradient measures 17.1 mmHg. Aortic valve area, by VTI measures 1.77 cm. Pulmonic Valve: The pulmonic valve was not well visualized. Pulmonic valve regurgitation is not visualized. No evidence of pulmonic stenosis. Aorta: The aortic root is normal in size and structure. Venous: The inferior vena cava is normal in size with greater than 50% respiratory variability, suggesting right atrial pressure of 3 mmHg. IAS/Shunts: The interatrial septum was not well visualized.  LEFT VENTRICLE PLAX 2D LVIDd:         5.90 cm   Diastology LVIDs:         3.80 cm   LV e' medial:    9.70 cm/s LV PW:         1.10 cm   LV E/e' medial:  14.7 LV IVS:        0.90 cm  LV e' lateral:   12.40 cm/s LVOT diam:     1.90 cm   LV E/e' lateral: 11.5 LV SV:         91 LV SV Index:   37        2D Longitudinal Strain LVOT Area:     2.84 cm  2D Strain GLS Avg:     -16.3 %  RIGHT VENTRICLE RV S prime:     16.00 cm/s TAPSE (M-mode): 3.4 cm LEFT ATRIUM             Index        RIGHT ATRIUM           Index LA diam:        5.00 cm 2.02 cm/m   RA Area:     24.80 cm LA Vol (A2C):   71.7 ml 28.95 ml/m  RA Volume:   84.00 ml  33.91 ml/m LA Vol (A4C):   94.7 ml 38.23 ml/m LA Biplane Vol: 84.2 ml 33.99 ml/m  AORTIC VALVE AV Area (Vmax):    1.92 cm AV Area (Vmean):   1.66 cm AV Area (VTI):     1.77 cm AV Vmax:           207.00 cm/s AV Vmean:          146.000 cm/s AV VTI:            0.514 m AV Peak Grad:      17.1 mmHg AV Mean Grad:      10.0 mmHg LVOT Vmax:         140.00 cm/s LVOT Vmean:        85.300 cm/s LVOT VTI:          0.320 m LVOT/AV VTI ratio: 0.62  AORTA Ao Root diam: 3.30 cm MITRAL VALVE                TRICUSPID VALVE MV Area (PHT): 2.73 cm     TR Peak grad:   34.8 mmHg MV Decel Time: 278 msec     TR Vmax:        295.00 cm/s MV E velocity: 143.00 cm/s MV A velocity: 95.90 cm/s   SHUNTS MV E/A ratio:  1.49         Systemic VTI:  0.32 m                             Systemic Diam: 1.90 cm Dorn Ross MD Electronically signed by  Dorn Ross MD Signature Date/Time: 01/19/2024/2:26:40 PM    Final     SIGNED: Adriana DELENA Grams, MD, FHM. FAAFP. Jolynn Pack - [facility] Time spent - 55 min.  In seeing, evaluating and examining the patient. Reviewing medical records, labs, drawn plan of care. [facility],  Pager (please use [email or web address] to page/ text) Please use Epic Secure Chat for non-urgent communication (7AM-7PM)  If 7PM-7AM, please contact night-coverage [email or web address], 01/20/2024, 11:26 AM

## 2024-01-20 NOTE — Progress Notes (Signed)
 Hypoglycemic Event  CBG: 68  Treatment: 4 oz juice  Symptoms: Hungry  Follow-up CBG:   Time:0759 CBG Result:77  Possible Reasons for Event: Unknown  Comments/MD notified: Shahmehdi    [employer]

## 2024-01-21 DIAGNOSIS — R1110 Vomiting, unspecified: Secondary | ICD-10-CM | POA: Diagnosis not present

## 2024-01-21 DIAGNOSIS — R197 Diarrhea, unspecified: Secondary | ICD-10-CM | POA: Diagnosis not present

## 2024-01-21 LAB — CBC
HCT: 23.6 % — ABNORMAL LOW (ref 36.0–46.0)
Hemoglobin: 7.6 g/dL — ABNORMAL LOW (ref 12.0–15.0)
MCH: 31.7 pg (ref 26.0–34.0)
MCHC: 32.2 g/dL (ref 30.0–36.0)
MCV: 98.3 fL (ref 80.0–100.0)
Platelets: 119 K/uL — ABNORMAL LOW (ref 150–400)
RBC: 2.4 MIL/uL — ABNORMAL LOW (ref 3.87–5.11)
RDW: 16.2 % — ABNORMAL HIGH (ref 11.5–15.5)
WBC: 4.3 K/uL (ref 4.0–10.5)
nRBC: 0 % (ref 0.0–0.2)

## 2024-01-21 LAB — BASIC METABOLIC PANEL WITH GFR
Anion gap: 8 (ref 5–15)
BUN: 16 mg/dL (ref 6–20)
CO2: 26 mmol/L (ref 22–32)
Calcium: 8 mg/dL — ABNORMAL LOW (ref 8.9–10.3)
Chloride: 101 mmol/L (ref 98–111)
Creatinine, Ser: 0.9 mg/dL (ref 0.44–1.00)
GFR, Estimated: >60 mL/min (ref >60)
Glucose, Bld: 74 mg/dL (ref 70–99)
Potassium: 3.2 mmol/L — ABNORMAL LOW (ref 3.5–5.1)
Sodium: 135 mmol/L (ref 135–145)

## 2024-01-21 LAB — GLUCOSE, CAPILLARY
Glucose-Capillary: 77 mg/dL (ref 70–99)
Glucose-Capillary: 79 mg/dL (ref 70–99)

## 2024-01-21 LAB — BRAIN NATRIURETIC PEPTIDE: B Natriuretic Peptide: 231 pg/mL — ABNORMAL HIGH (ref 0.0–100.0)

## 2024-01-21 MED ORDER — POTASSIUM CHLORIDE CRYS ER 20 MEQ PO TBCR
40.0000 meq | EXTENDED_RELEASE_TABLET | Freq: Once | ORAL | Status: AC
  Administered 2024-01-21: 40 meq via ORAL
  Filled 2024-01-21: qty 2

## 2024-01-21 MED ORDER — FOLIC ACID 1 MG PO TABS: 1.0000 mg | ORAL_TABLET | Freq: Every day | ORAL | 1 refills | Status: AC

## 2024-01-21 MED ORDER — ALBUMIN HUMAN 25 % IV SOLN
25.0000 g | Freq: Once | INTRAVENOUS | Status: AC
  Administered 2024-01-21: 25 g via INTRAVENOUS
  Filled 2024-01-21: qty 100

## 2024-01-21 MED ORDER — LACTINEX PO CHEW
1.0000 | CHEWABLE_TABLET | Freq: Three times a day (TID) | ORAL | 0 refills | Status: AC
Start: 2024-01-21 — End: 2024-01-31

## 2024-01-21 MED ORDER — FUROSEMIDE 10 MG/ML IJ SOLN
40.0000 mg | Freq: Once | INTRAMUSCULAR | Status: AC
  Administered 2024-01-21: 40 mg via INTRAVENOUS
  Filled 2024-01-21: qty 4

## 2024-01-21 MED ORDER — NYSTATIN 100000 UNIT/GM EX POWD: Freq: Three times a day (TID) | CUTANEOUS | 0 refills | Status: AC

## 2024-01-21 NOTE — Discharge Summary (Addendum)
 Physician Discharge Summary   Patient: Lynn Andrade MRN: [id number] DOB: [birth year]  Admit date:     01/18/2024  Discharge date: 01/21/24  Discharge Physician: Adriana DELENA Grams   PCP: Samie Frederick, PA-C   Recommendations at discharge:  -Follow-up with the PCP in 1-4 weeks - Continue currently recommended medications  Discharge Diagnoses: Principal Problem:   Vomiting and diarrhea Active Problems:   Acute on chronic diastolic CHF (congestive heart failure) (HCC)   Morbid obesity (HCC)   HTN (hypertension)   DM (diabetes mellitus) (HCC)  Resolved Problems:   * No resolved hospital problems. *  Hospital Course: Lynn Andrade is a 53 y.o. female with medical history significant for CHF, diabetes mellitus, hypertension.  Patient presented to the ED with complaints of vomiting, diarrhea and back pain.   Symptoms started about 3 weeks ago.  She was at [facility] 9/4 for vomiting and diarrhea, she had just completed a course of Macrobid  for UTI at that time.  CTAP W contrast-no acute abnormality.  Showed a hypoattenuating liver focus-short-term MRI follow-up recommended per Care Everywhere. She had stool tests-9/12 that were positive for C. difficile and Shigella.    She was given a 3-day course of azithromycin she has completed, and is on the third day of oral vancomycin .  She reports persistent symptoms of multiple episodes of watery stools, with vomiting.  She reports she has been taking antidiarrheal medications and still having severe diarrhea.  No abdominal pain no fevers no chills. Over the past 3 weeks she has barely taken her Lasix  40 mg twice daily.  In the interim she has had progressive bilateral lower extremity swelling, abdominal bloating, with reported 40 pound weight gain.   ED Course: Temperature 98.5.  Heart rate 79-91.  Respirate rate 18-20.  Blood pressure 105-130.  O2 sats greater than 97% on room air. Potassium 3.3.  WBC 11.1.  BNP elevated at 171. Lasix  40 mg x 1  given.    *C. difficile colitis  - with nausea, vomiting and diarrhea -Nausea vomiting diarrhea x 3 weeks -Nausea vomiting has improved, tolerating p.o. -Diarrhea improving only 4 episodes past 24 hours per patient, forming     -- Recent imaging of abdomen pelvis abnormal on 9 /4 revealed no acute abnormalities - Stool for C. difficile toxin and Shigella positive - Completed 3 days of azithromycin, s/p PE 3 days of p.o. vancomycin  - Patient now started on Fidaxomycin considering ongoing severity.    ? chronic diastolic CHF (congestive heart failure) (HCC)  -associated with extensive lower extremity edema -Based on last echocardiogram, preserved ejection fraction, grade 1 diastolic dysfunction -BNP 171>> 162.0 - +3 pitting edema lower extremities, abdominal bloating -On p.o. diuretics at home despite taking diuretics still gaining weight per patient 35 pounds past couple weeks-believe that his fluids  -Due to ongoing diarrhea holding diuretics -Lasix  40 mg IV x 1 was given in ED, considering giving another dose    Due to the GI losses, she has held off on taking her Lasix  40 mg twice daily over the past 3 weeks to avoid dehydration.   Last echo 2023 EF of 60 to 65%. -Repeat echo >> ejection fraction 60-65%, no LVH, normal LV, RV compromise or hypertrophy No signs of heart failure on echocardiogram - Chest x-ray shows cardiomegaly with pulmonary edema.   -Obtain updated echocardiogram -Strict input output, daily weight, daily BMP   ? DM (diabetes mellitus) (HCC) -Blood sugars: 158, 78, 71 - HgbA1c  <4.2 - Continue Farxiga   Acute on chronic anemia-anemia of chronic disease  - Obtaining iron studies : Total iron 30  within normal limits, Folate low at 4.6, B12 normal at 743 - Monitoring H&H closely - Added  Folate supplement     HTN (hypertension) Systolic 105-130. -Hold Lasix  and spironolactone          Disposition: Home Diet recommendation:  Discharge Diet Orders  (From admission, onward)     Start     Ordered   01/20/24 0000  Diet - low sodium heart healthy        01/20/24 1124           Cardiac and Carb modified diet DISCHARGE MEDICATION: Allergies as of 01/21/2024       Reactions   Aspirin-caffeine Hives, Swelling   Tape Dermatitis   Paper tape is okay   Toradol [ketorolac Tromethamine] Other (See Comments)   Unknown    Etodolac Hives   Shrimp (diagnostic) Hives        Medication List     STOP taking these medications    omeprazole 10 MG capsule Commonly known as: PRILOSEC   vancomycin  125 MG capsule Commonly known as: VANCOCIN        TAKE these medications    ALBUTEROL  SULFATE HFA IN Inhale 2 Inhalations into the lungs as needed (for allergies).   Cholecalciferol  1.25 MG (50000 UT) capsule Take 50,000 Units by mouth once a week.   Farxiga  10 MG Tabs tablet Generic drug: dapagliflozin  propanediol TAKE 1 TABLET BY MOUTH ONCE DAILY BEFORE BREAKFAST   FeroSul 325 (65 Fe) MG tablet Generic drug: ferrous sulfate  Take 325 mg by mouth 2 (two) times daily.   fidaxomicin  200 MG Tabs tablet Commonly known as: DIFICID  Take 1 tablet (200 mg total) by mouth 2 (two) times daily for 10 days.   folic acid  1 MG tablet Commonly known as: FOLVITE  Take 1 tablet (1 mg total) by mouth daily.   lactobacillus Pack Take 1 packet (1 g total) by mouth 3 (three) times daily with meals for 10 days.   lactobacillus acidophilus & bulgar chewable tablet Chew 1 tablet by mouth 3 (three) times daily with meals for 10 days.   levothyroxine  25 MCG tablet Commonly known as: SYNTHROID  Take 25 mcg by mouth daily.   metolazone  5 MG tablet Commonly known as: ZAROXOLYN  Take 5 mg by mouth daily as needed (fluid, swelling).   Nasacort  Allergy 24HR 55 MCG/ACT Aero nasal inhaler Generic drug: triamcinolone  Place 2 sprays into the nose daily.   nystatin  powder Commonly known as: MYCOSTATIN /NYSTOP  Apply topically 3 (three) times daily.    sertraline  50 MG tablet Commonly known as: ZOLOFT  Take 1 tablet by mouth daily.        Discharge Exam: Filed Weights   01/18/24 1325 01/19/24 1336  Weight: (!) 152 kg (!) 160.1 kg        General:  AAO x 3,  cooperative, no distress;   HEENT:  Normocephalic, PERRL, otherwise with in Normal limits   Neuro:  CNII-XII intact. , normal motor and sensation, reflexes intact   Lungs:   Clear to auscultation BL, Respirations unlabored,  No wheezes / crackles  Cardio:    S1/S2, RRR, No murmure, No Rubs or Gallops   Abdomen:  Soft, non-tender, bowel sounds active all four quadrants, no guarding or peritoneal signs.  Muscular  skeletal:  Limited exam -global generalized weaknesses - in bed, able to move all 4 extremities,   2+ pulses,  symmetric, +2 pitting edema  Skin:  Dry, warm to touch, negative for any Rashes,  Wounds: Please see nursing documentation          Condition at discharge: good  The results of significant diagnostics from this hospitalization (including imaging, microbiology, ancillary and laboratory) are listed below for reference.   Imaging Studies: ECHOCARDIOGRAM COMPLETE Result Date: 01/19/2024    ECHOCARDIOGRAM REPORT   Patient Name:   JOURNII NIERMAN Date of Exam: 01/19/2024 Medical Rec #:  [id number]      Height:       65.5 in Accession #:    [id number]     Weight:       335.0 lb Date of Birth:  1970/05/07      BSA:          2.477 m Patient Age:    53 years       BP:           118/54 mmHg Patient Gender: F              HR:           72 bpm. Exam Location:  [facility] Procedure: 2D Echo, Cardiac Doppler, Color Doppler and Strain Analysis (Both            Spectral and Color Flow Doppler were utilized during procedure). Indications:    CHF-Acute Diastolic I50.31  History:        Patient has prior history of Echocardiogram examinations, most                 recent 01/26/2022. Signs/Symptoms:Murmur; Risk Factors:Sleep                 Apnea, Dyslipidemia, Diabetes and  Hypertension.  Sonographer:    Aida Pizza RCS Referring Phys: [phone] Johanan Skorupski A Elyna Pangilinan  Sonographer Comments: Global longitudinal strain was attempted. IMPRESSIONS  1. Left ventricular ejection fraction, by estimation, is 60 to 65%. The left ventricle has normal function. The left ventricle has no regional wall motion abnormalities. Left ventricular diastolic parameters were normal. The average left ventricular global longitudinal strain is -16.3 %. The global longitudinal strain is normal.  2. Right ventricular systolic function is normal. The right ventricular size is normal.  3. The mitral valve is normal in structure. Trivial mitral valve regurgitation. No evidence of mitral stenosis.  4. The tricuspid valve is abnormal.  5. The aortic valve is tricuspid. Aortic valve regurgitation is not visualized. No aortic stenosis is present. Aortic valve area, by VTI measures 1.77 cm. Aortic valve mean gradient measures 10.0 mmHg.  6. The inferior vena cava is normal in size with greater than 50% respiratory variability, suggesting right atrial pressure of 3 mmHg. FINDINGS  Left Ventricle: Left ventricular ejection fraction, by estimation, is 60 to 65%. The left ventricle has normal function. The left ventricle has no regional wall motion abnormalities. The average left ventricular global longitudinal strain is -16.3 %. Strain was performed and the global longitudinal strain is normal. The left ventricular internal cavity size was normal in size. There is no left ventricular hypertrophy. Left ventricular diastolic parameters were normal. Right Ventricle: The right ventricular size is normal. Right vetricular wall thickness was not well visualized. Right ventricular systolic function is normal. Left Atrium: Left atrial size was normal in size. Right Atrium: Right atrial size was normal in size. Pericardium: There is no evidence of pericardial effusion. Mitral Valve: The mitral valve is normal in structure. Trivial  mitral valve regurgitation. No evidence of mitral valve stenosis. Tricuspid  Valve: The tricuspid valve is abnormal. Tricuspid valve regurgitation is mild . No evidence of tricuspid stenosis. Aortic Valve: The aortic valve is tricuspid. Aortic valve regurgitation is not visualized. No aortic stenosis is present. Aortic valve mean gradient measures 10.0 mmHg. Aortic valve peak gradient measures 17.1 mmHg. Aortic valve area, by VTI measures 1.77 cm. Pulmonic Valve: The pulmonic valve was not well visualized. Pulmonic valve regurgitation is not visualized. No evidence of pulmonic stenosis. Aorta: The aortic root is normal in size and structure. Venous: The inferior vena cava is normal in size with greater than 50% respiratory variability, suggesting right atrial pressure of 3 mmHg. IAS/Shunts: The interatrial septum was not well visualized.  LEFT VENTRICLE PLAX 2D LVIDd:         5.90 cm   Diastology LVIDs:         3.80 cm   LV e' medial:    9.70 cm/s LV PW:         1.10 cm   LV E/e' medial:  14.7 LV IVS:        0.90 cm   LV e' lateral:   12.40 cm/s LVOT diam:     1.90 cm   LV E/e' lateral: 11.5 LV SV:         91 LV SV Index:   37        2D Longitudinal Strain LVOT Area:     2.84 cm  2D Strain GLS Avg:     -16.3 %  RIGHT VENTRICLE RV S prime:     16.00 cm/s TAPSE (M-mode): 3.4 cm LEFT ATRIUM             Index        RIGHT ATRIUM           Index LA diam:        5.00 cm 2.02 cm/m   RA Area:     24.80 cm LA Vol (A2C):   71.7 ml 28.95 ml/m  RA Volume:   84.00 ml  33.91 ml/m LA Vol (A4C):   94.7 ml 38.23 ml/m LA Biplane Vol: 84.2 ml 33.99 ml/m  AORTIC VALVE AV Area (Vmax):    1.92 cm AV Area (Vmean):   1.66 cm AV Area (VTI):     1.77 cm AV Vmax:           207.00 cm/s AV Vmean:          146.000 cm/s AV VTI:            0.514 m AV Peak Grad:      17.1 mmHg AV Mean Grad:      10.0 mmHg LVOT Vmax:         140.00 cm/s LVOT Vmean:        85.300 cm/s LVOT VTI:          0.320 m LVOT/AV VTI ratio: 0.62  AORTA Ao Root diam:  3.30 cm MITRAL VALVE                TRICUSPID VALVE MV Area (PHT): 2.73 cm     TR Peak grad:   34.8 mmHg MV Decel Time: 278 msec     TR Vmax:        295.00 cm/s MV E velocity: 143.00 cm/s MV A velocity: 95.90 cm/s   SHUNTS MV E/A ratio:  1.49         Systemic VTI:  0.32 m  Systemic Diam: 1.90 cm Dorn Ross MD Electronically signed by Dorn Ross MD Signature Date/Time: 01/19/2024/2:26:40 PM    Final    DG CHEST PORT 1 VIEW Result Date: 01/18/2024 CLINICAL DATA:  Leg swelling. Lower extremity edema. History of CHF. EXAM: PORTABLE CHEST 1 VIEW COMPARISON:  01/25/2022 FINDINGS: The heart is enlarged. Diffuse interstitial thickening likely pulmonary edema. No significant pleural effusion. No focal airspace disease. No pneumothorax. Thoracic spondylosis. IMPRESSION: Cardiomegaly with pulmonary edema. Electronically Signed   By: Andrea Gasman M.D.   On: 01/18/2024 20:41    Microbiology: Results for orders placed or performed during the hospital encounter of 11/10/21  Urine Culture     Status: Abnormal   Collection Time: 11/10/21  5:17 PM   Specimen: Urine, Clean Catch  Result Value Ref Range Status   Specimen Description URINE, CLEAN CATCH  Final   Special Requests   Final    NONE Performed at [facility], [address] [place], [place] [address]    Culture MULTIPLE SPECIES PRESENT, SUGGEST RECOLLECTION (A)  Final   Report Status 11/12/2021 FINAL  Final    Labs: CBC: Recent Labs  Lab 01/18/24 1344 01/19/24 0340 01/19/24 0800 01/20/24 0725 01/21/24 0437  WBC 11.1* 6.9  --  6.5 4.3  NEUTROABS 9.1*  --   --   --   --   HGB 8.8* 7.3* 7.8* 8.0* 7.6*  HCT 27.9* 22.3* 23.9* 25.2* 23.6*  MCV 100.7* 99.1  --  100.8* 98.3  PLT 130* 109*  --  127* 119*   Basic Metabolic Panel: Recent Labs  Lab 01/18/24 1344 01/19/24 0340 01/20/24 0354 01/21/24 0437  NA 137 135 135 135  K 3.3* 3.9 4.0 3.2*  CL 103 107 104 101  CO2 24 24 27 26   GLUCOSE 73  80 78 74  BUN 14 15 15 16   CREATININE 0.93 0.99 0.89 0.90  CALCIUM 8.5* 7.8* 7.9* 8.0*   Liver Function Tests: Recent Labs  Lab 01/18/24 1344  AST 23  ALT 18  ALKPHOS 68  BILITOT 1.9*  PROT 6.4*  ALBUMIN  2.6*   CBG: Recent Labs  Lab 01/20/24 0759 01/20/24 1100 01/20/24 1645 01/20/24 2106 01/21/24 0715  GLUCAP 77 71 76 83 77    Discharge time spent: less than 30 minutes.  Signed: Adriana DELENA Grams, MD [facility] 01/21/2024

## 2024-01-21 NOTE — Plan of Care (Signed)
  Problem: Education: Goal: Ability to describe self-care measures that may prevent or decrease complications (Diabetes Survival Skills Education) will improve Outcome: Progressing   Problem: Coping: Goal: Ability to adjust to condition or change in health will improve Outcome: Progressing   Problem: Fluid Volume: Goal: Ability to maintain a balanced intake and output will improve Outcome: Progressing   Problem: Nutritional: Goal: Progress toward achieving an optimal weight will improve Outcome: Progressing   Problem: Skin Integrity: Goal: Risk for impaired skin integrity will decrease Outcome: Progressing   Problem: Education: Goal: Knowledge of General Education information will improve Description: Including pain rating scale, medication(s)/side effects and non-pharmacologic comfort measures Outcome: Progressing

## 2024-02-06 NOTE — Progress Notes (Signed)
 Subjective   Patient ID:  Lynn Andrade is a 54 y.o. (DOB Dec 23, 1970) female.   Chief Complaint  Patient presents with  . Hospital Follow Up    Patient was having diarrhea and vomiting, dx with c-diff and shigella, she was getting worse before she went in to hospital. Patient admitted to [facility] on 02/01/2024 for 4 days. ?Heart Failure, referred to vascular specialist for leg swelling. 353lbs when she went into the hospital, fluid retention. Low iron levels, need to be rechecked.      HPI  Pt presents for hospital follow-up.  Physician Discharge Summary  Patient: Lynn Andrade MRN: [id number] DOB: 06/03/1970  Admit date: 01/18/2024  Discharge date: 01/21/24  Discharge Physician: Adriana DELENA Grams   PCP: Samie Frederick, PA-C   Recommendations at discharge:  -Follow-up with the PCP in 1-4 weeks - Continue currently recommended medications  Discharge Diagnoses: Principal Problem: Vomiting and diarrhea Active Problems: Acute on chronic diastolic CHF (congestive heart failure) (HCC) Morbid obesity (HCC) HTN (hypertension) DM (diabetes mellitus) (HCC)  Resolved Problems: * No resolved hospital problems. *  Hospital Course: Lynn Andrade is a 53 y.o. female with medical history significant for CHF, diabetes mellitus, hypertension. Patient presented to the ED with complaints of vomiting, diarrhea and back pain.  Symptoms started about 3 weeks ago. She was at [facility] 9/4 for vomiting and diarrhea, she had just completed a course of Macrobid  for UTI at that time. CTAP W contrast-no acute abnormality. Showed a hypoattenuating liver focus-short-term MRI follow-up recommended per Care Everywhere. She had stool tests-9/12 that were positive for C. difficile and Shigella.   She was given a 3-day course of azithromycin she has completed, and is on the third day of oral vancomycin . She reports persistent symptoms of multiple episodes of watery stools, with vomiting. She reports she has  been taking antidiarrheal medications and still having severe diarrhea. No abdominal pain no fevers no chills. Over the past 3 weeks she has barely taken her Lasix  40 mg twice daily. In the interim she has had progressive bilateral lower extremity swelling, abdominal bloating, with reported 40 pound weight gain.  ED Course: Temperature 98.5. Heart rate 79-91. Respirate rate 18-20. Blood pressure 105-130. O2 sats greater than 97% on room air. Potassium 3.3. WBC 11.1. BNP elevated at 171. Lasix  40 mg x 1 given.  *C. difficile colitis - with nausea, vomiting and diarrhea -Nausea vomiting diarrhea x 3 weeks -Nausea vomiting has improved, tolerating p.o. -Diarrhea improving only 4 episodes past 24 hours per patient, forming  -- Recent imaging of abdomen pelvis abnormal on 9 /4 revealed no acute abnormalities - Stool for C. difficile toxin and Shigella positive - Completed 3 days of azithromycin, s/p PE 3 days of p.o. vancomycin  - Patient now started on Fidaxomycin considering ongoing severity.  ? chronic diastolic CHF (congestive heart failure) (HCC)  -associated with extensive lower extremity edema -Based on last echocardiogram, preserved ejection fraction, grade 1 diastolic dysfunction -BNP 171>> 162.0 - +3 pitting edema lower extremities, abdominal bloating -On p.o. diuretics at home despite taking diuretics still gaining weight per patient 35 pounds past couple weeks-believe that his fluids  -Due to ongoing diarrhea holding diuretics -Lasix  40 mg IV x 1 was given in ED, considering giving another dose  Due to the GI losses, she has held off on taking her Lasix  40 mg twice daily over the past 3 weeks to avoid dehydration.  Last echo 2023 EF of 60 to 65%. -Repeat echo >> ejection fraction  60-65%, no LVH, normal LV, RV compromise or hypertrophy No signs of heart failure on echocardiogram - Chest x-ray shows cardiomegaly with pulmonary edema.  -Obtain updated echocardiogram -Strict input  output, daily weight, daily BMP  ? DM (diabetes mellitus) (HCC) -Blood sugars: 158, 78, 71 - HgbA1c <4.2 - Continue Farxiga   Acute on chronic anemia-anemia of chronic disease  - Obtaining iron studies : Total iron 30 within normal limits, Folate low at 4.6, B12 normal at 743 - Monitoring H&H closely - Added Folate supplement  HTN (hypertension) Systolic 105-130. -Hold Lasix  and spironolactone   Disposition: Home Diet recommendation:  Discharge Diet Orders (From admission, onward)   Start Ordered  01/20/24 0000 Diet - low sodium heart healthy  01/20/24 1124   Cardiac and Carb modified diet DISCHARGE MEDICATION: Allergies as of 01/21/2024   Reactions  Aspirin-caffeine Hives, Swelling  Tape Dermatitis  Paper tape is okay  Toradol [ketorolac Tromethamine] Other (See Comments)  Unknown  Etodolac Hives  Shrimp (diagnostic) Hives    Medication List   STOP taking these medications   omeprazole 10 MG capsule Commonly known as: PRILOSEC  vancomycin  125 MG capsule Commonly known as: VANCOCIN    TAKE these medications   ALBUTEROL  SULFATE HFA IN Inhale 2 Inhalations into the lungs as needed (for allergies).  Cholecalciferol  1.25 MG (50000 UT) capsule Take 50,000 Units by mouth once a week.  Farxiga  10 MG Tabs tablet Generic drug: dapagliflozin  propanediol TAKE 1 TABLET BY MOUTH ONCE DAILY BEFORE BREAKFAST  FeroSul 325 (65 Fe) MG tablet Generic drug: ferrous sulfate  Take 325 mg by mouth 2 (two) times daily.  fidaxomicin  200 MG Tabs tablet Commonly known as: DIFICID  Take 1 tablet (200 mg total) by mouth 2 (two) times daily for 10 days.  folic acid  1 MG tablet Commonly known as: FOLVITE  Take 1 tablet (1 mg total) by mouth daily.  lactobacillus Pack Take 1 packet (1 g total) by mouth 3 (three) times daily with meals for 10 days.  lactobacillus acidophilus & bulgar chewable tablet Chew 1 tablet by mouth 3 (three) times daily with meals for 10  days.  levothyroxine  25 MCG tablet Commonly known as: SYNTHROID  Take 25 mcg by mouth daily.  metolazone  5 MG tablet Commonly known as: ZAROXOLYN  Take 5 mg by mouth daily as needed (fluid, swelling).  Nasacort  Allergy 24HR 55 MCG/ACT Aero nasal inhaler Generic drug: triamcinolone  Place 2 sprays into the nose daily.  nystatin  powder Commonly known as: MYCOSTATIN /NYSTOP  Apply topically 3 (three) times daily.  sertraline  50 MG tablet Commonly known as: ZOLOFT  Take 1 tablet by mouth daily.  Discharge Exam: Filed Weights  01/18/24 1325 01/19/24 1336  Weight: (!) 152 kg (!) 160.1 kg   Signed: Adriana DELENA Grams, MD [facility] 01/21/2024   GI sx's have been better since she got home. Had some diarrhea yesterday, but better today. No n/v. She is eating yogurt with every meal. Minimal abd pain. No fever/chills. Swelling has improved, but still present. She is wearing compression socks while working. She is back on all meds. Significantly anemic while at the hospital - she is taking folic acid  regularly. Denies any blood in stool or urine. Has not been on potassium recently - out x 2 days. She is not taking any iron supplements. R lower leg is more swollen - had an injury on 9/24 and was seen at [facility]. Has noticed that the redness is improving.  Hospital records reviewed in detail.  Problem List, Past Medical History, Past Surgical History, Past Family  History, Social History, Medications, and Allergies, were reviewed and updated as appropriate.   Review of Systems Review of Systems  Constitutional:  Negative for chills, fatigue and fever.  Respiratory:  Negative for cough, chest tightness and shortness of breath.   Cardiovascular:  Positive for leg swelling. Negative for chest pain and palpitations.  Gastrointestinal:  Positive for diarrhea (minimal). Negative for abdominal pain, blood in stool, nausea and vomiting.  Genitourinary:  Negative for dysuria.  Skin:   Positive for color change (R lower leg) and wound (R lower leg). Negative for rash.  Neurological:  Negative for dizziness, syncope, weakness, light-headedness and headaches.     Objective   BP 124/72 (BP Location: Left Upper Arm, Patient Position: Sitting)   Pulse 84   Temp 97.3 F (36.3 C) (Oral)   Resp 18   Ht 5' 6 (1.676 m)   Wt (!) 302 lb (137 kg)   LMP  (LMP Unknown)   SpO2 98%   BMI 48.74 kg/m   Physical Exam Vitals and nursing note reviewed.  Constitutional:      General: She is not in acute distress.    Appearance: Normal appearance. She is well-developed. She is not ill-appearing or toxic-appearing.  HENT:     Head: Normocephalic and atraumatic.     Mouth/Throat:     Lips: Pink.     Mouth: Mucous membranes are moist.  Cardiovascular:     Rate and Rhythm: Normal rate and regular rhythm.     Pulses:          Dorsalis pedis pulses are 2+ on the right side and 2+ on the left side.     Heart sounds: Normal heart sounds, S1 normal and S2 normal. No murmur heard.    No friction rub. No gallop.     Comments: Edema extends to the knees bilaterally. Worse distally, esp along feet. Musculoskeletal:     Cervical back: Neck supple.     Right lower leg: 1+ Pitting Edema present.     Left lower leg: 1+ Pitting Edema present.  Pulmonary:     Effort: Pulmonary effort is normal.     Breath sounds: Normal breath sounds. No decreased breath sounds, wheezing, rhonchi or rales.  Skin:    General: Skin is warm and dry.     Findings: Erythema and wound present. No rash.      Neurological:     General: No focal deficit present.     Mental Status: She is alert and oriented to person, place, and time.  Psychiatric:        Mood and Affect: Mood normal.        Behavior: Behavior normal.        Thought Content: Thought content normal.        Judgment: Judgment normal.    --------------------------- Labs last 12 hours No results found for this or any previous visit (from the  past 12 hours).    Impression   1. Clostridium difficile colitis  Anemia Profile B   Comprehensive Metabolic Panel   Comprehensive Metabolic Panel   Anemia Profile B    2. Acute on chronic diastolic CHF (congestive heart failure) (*)  Comprehensive Metabolic Panel   NT-proBNP   NT-proBNP   Comprehensive Metabolic Panel    3. Anemia of chronic disease  Anemia Profile B   Comprehensive Metabolic Panel   Comprehensive Metabolic Panel   Anemia Profile B    4. Essential hypertension  Comprehensive Metabolic Panel  Comprehensive Metabolic Panel    5. Cellulitis of right leg  Anemia Profile B   cefTRIAXone  (ROCEPHIN ) injection 1 g   Anemia Profile B    6. Acquired hypothyroidism  TSH   TSH       Plan  Reassurance.  C DIFF: Fortunately her GI sx's seem to be much better at this point. Slight diarrhea yesterday. Will continue to monitor. Bland diet and increased fluids.  HX CHF: Bnp at hospital did not prove definite CHF, however, pt was retaining significant fluid. This seems much better, but still with pitting edema. Continue furosemide  40mg  bid and metolazone  5mg  daily. Elevate legs. Limit sodium intake. Consider potassium supplement Rx. Check proBNP and CMP.  ANEMIA: Labs at the hospital revealed low iron and folate. Continue folic acid . Will check anemia profile. Consider iron supplements vs referral for infusion.  CELLULITIS R LEG: Rocephin  1gm given today. Elevate and ice leg. Clean wound daily with soap and water. She will let us  know how the leg is tomorrow. Consider referral for further evaluation of probable lymphedema.  HYPOTHYROIDISM: Check TSH. Continue current dose of levothyroxine  - will adjust if indicated.  See encounter after visit summary for patient specific instructions.  I have reviewed the information contained in this note and personally verified its accuracy.  I obtained the history of present illness and personally performed the physical  exam.  Patient verbalized to me that they understood what their problem is, what they need to do about it and why it is important that they do it.   Follow up for recheck as needed.    Orders Placed This Encounter  Procedures  . Anemia Profile B  . Comprehensive Metabolic Panel  . NT-proBNP  . TSH     Patient's Medications       * Accurate as of February 07, 2024  6:17 PM. Reflects encounter med changes as of last refresh          Continued Medications      Instructions  ADEKS PO  4 times a day   albuterol  sulfate HFA 108 (90 Base) MCG/ACT inhaler Commonly known as: PROVENTIL ,VENTOLIN ,PROAIR   INHALE 2 PUFFS BY MOUTH EVERY 6 HOURS AS NEEDED FOR WHEEZING AND FOR SHORTNESS OF BREATH   azelastine 0.1 % nasal spray Commonly known as: ASTELIN  1 spray, Nasal, 2 times a day   B-12 PO  Daily   Biotin 10 MG Caps  Take by mouth.   budesonide-formoterol 160-4.5 mcg/actuation inhaler Commonly known as: SYMBICORT  2 puffs, Inhalation, 2 times a day   CALTRATE 600+D PO  4 times a day   FARXIGA  10 MG tablet Generic drug: dapagliflozin   10 mg, Every morning   folic acid  1 mg tablet  1,000 mcg, Daily   furosemide  40 mg tablet Commonly known as: LASIX   40 mg, Oral, 2 times a day   IRON PO  Daily   levocetirizine 5 mg tablet Commonly known as: XYZAL  5 mg, Every evening   levothyroxine  sodium 25 mcg tablet Commonly known as: SYNTHROID   25 mcg, Oral, Daily, Take in the morning on an empty stomach and wait 30 minutes to eat or take other meds.   metolazone  5 mg tablet Commonly known as: ZAROXOLYN   5 mg, Oral, Daily as needed   MILK THISTLE PO  2 times a day   nystatin  powder Commonly known as: MYCOSTATIN   3 times daily   traZODone 50 mg tablet Commonly known as: DESYREL  Take 0.5 to 1  tablet at bedtime.   valacyclovir 1000 mg tablet Commonly known as: VALTREX  2,000 mg, Oral, 2 times a day, As needed for cold sores.   vitamin A 10,000 units  capsule  10,000 Units, Daily   VITAMIN B COMPLEX PO  Take by mouth.   VITAMIN C PO  Take by mouth.   vitamin D3 (cholecalciferol ) 50,000 units Caps Commonly known as: OPTIMAL-D  1 capsule, Oral, Weekly   ZINC PO  Take by mouth.        Risks, benefits, and alternatives of the medications and treatment plan prescribed today were discussed, and patient expressed understanding. Plan follow-up as discussed or as needed if any worsening symptoms or change in condition.    A yearly health maintenance exam was recommended where appropriate.     *Some images could not be shown.

## 2024-02-09 ENCOUNTER — Encounter (HOSPITAL_COMMUNITY): Payer: Self-pay | Admitting: *Deleted

## 2024-02-09 ENCOUNTER — Emergency Department (HOSPITAL_COMMUNITY)

## 2024-02-09 ENCOUNTER — Emergency Department (HOSPITAL_COMMUNITY)
Admission: EM | Admit: 2024-02-09 | Discharge: 2024-02-09 | Disposition: A | Discharge status: Home / Self Care | Attending: Emergency Medicine | Admitting: Emergency Medicine

## 2024-02-09 ENCOUNTER — Other Ambulatory Visit: Payer: Self-pay

## 2024-02-09 DIAGNOSIS — I5033 Acute on chronic diastolic (congestive) heart failure: Secondary | ICD-10-CM | POA: Diagnosis not present

## 2024-02-09 DIAGNOSIS — E119 Type 2 diabetes mellitus without complications: Secondary | ICD-10-CM | POA: Diagnosis not present

## 2024-02-09 DIAGNOSIS — E039 Hypothyroidism, unspecified: Secondary | ICD-10-CM | POA: Diagnosis not present

## 2024-02-09 DIAGNOSIS — R7989 Other specified abnormal findings of blood chemistry: Secondary | ICD-10-CM | POA: Diagnosis present

## 2024-02-09 DIAGNOSIS — R609 Edema, unspecified: Secondary | ICD-10-CM

## 2024-02-09 LAB — CBC WITH DIFFERENTIAL/PLATELET
Abs Immature Granulocytes: 0.01 K/uL (ref 0.00–0.07)
Basophils Absolute: 0 K/uL (ref 0.0–0.1)
Basophils Relative: 0 %
Eosinophils Absolute: 0.1 K/uL (ref 0.0–0.5)
Eosinophils Relative: 2 %
HCT: 30.5 % — ABNORMAL LOW (ref 36.0–46.0)
Hemoglobin: 9.8 g/dL — ABNORMAL LOW (ref 12.0–15.0)
Immature Granulocytes: 0 %
Lymphocytes Relative: 26 %
Lymphs Abs: 1.4 K/uL (ref 0.7–4.0)
MCH: 32.8 pg (ref 26.0–34.0)
MCHC: 32.1 g/dL (ref 30.0–36.0)
MCV: 102 fL — ABNORMAL HIGH (ref 80.0–100.0)
Monocytes Absolute: 0.8 K/uL (ref 0.1–1.0)
Monocytes Relative: 14 %
Neutro Abs: 3.1 K/uL (ref 1.7–7.7)
Neutrophils Relative %: 58 %
Platelets: 191 K/uL (ref 150–400)
RBC: 2.99 MIL/uL — ABNORMAL LOW (ref 3.87–5.11)
RDW: 15.4 % (ref 11.5–15.5)
WBC: 5.4 K/uL (ref 4.0–10.5)
nRBC: 0 % (ref 0.0–0.2)

## 2024-02-09 LAB — BASIC METABOLIC PANEL WITH GFR
Anion gap: 5 (ref 5–15)
BUN: 10 mg/dL (ref 6–20)
CO2: 30 mmol/L (ref 22–32)
Calcium: 8.6 mg/dL — ABNORMAL LOW (ref 8.9–10.3)
Chloride: 99 mmol/L (ref 98–111)
Creatinine, Ser: 0.8 mg/dL (ref 0.44–1.00)
GFR, Estimated: >60 mL/min (ref >60)
Glucose, Bld: 80 mg/dL (ref 70–99)
Potassium: 3.2 mmol/L — ABNORMAL LOW (ref 3.5–5.1)
Sodium: 134 mmol/L — ABNORMAL LOW (ref 135–145)

## 2024-02-09 LAB — MAGNESIUM: Magnesium: 2.1 mg/dL (ref 1.7–2.4)

## 2024-02-09 LAB — PRO BRAIN NATRIURETIC PEPTIDE: Pro Brain Natriuretic Peptide: 935 pg/mL — ABNORMAL HIGH (ref <300.0)

## 2024-02-09 NOTE — ED Triage Notes (Signed)
 Pt sent by her doctor due to elevated BNP.   Also states she has a wound to right lower leg. Got Rocephin  IM Tuesday in the office.

## 2024-02-09 NOTE — ED Provider Notes (Signed)
 [place] EMERGENCY DEPARTMENT AT [facility] Provider Note   CSN: [id number] Arrival date & time: 02/09/24  1143     Patient presents with: abnormal lab   Lynn Andrade is a 53 y.o. female.   HPI   This patient is a 53 year old female, she has a history of diabetes, hypothyroidism and significant fluid retention which has required her being admitted to the hospital several times most recently a couple of weeks ago and prior to that in 2023 when she had had a severe weight gain of over 50 pounds of fluid that required diuresis.  The echocardiogram from 2023 showed a grade 1 diastolic dysfunction but a normal ejection fraction.  When the echocardiogram was repeated 2 weeks ago she had a normal ejection fraction and no signs of diastolic dysfunction.  Her recheck of labs in the office within the last 24 hours showed that her BNP was over 900 concerning her so she came in to be checked.  It was 200 when she was in the hospital a couple of weeks ago.  She denies shortness of breath she denies significant increase in leg swelling in fact she states that she is becoming less swollen every day with her daily medications including furosemide  40 mg twice daily and metolazone   Prior to Admission medications   Medication Sig Start Date End Date Taking? Authorizing Provider  ALBUTEROL  SULFATE HFA IN Inhale 2 Inhalations into the lungs as needed (for allergies).    [provider]  Cholecalciferol  1.25 MG (50000 UT) capsule Take 50,000 Units by mouth once a week. 12/28/23   [provider]  dapagliflozin  propanediol (FARXIGA ) 10 MG TABS tablet TAKE 1 TABLET BY MOUTH ONCE DAILY BEFORE BREAKFAST 05/17/22   Chandrasekhar, Mahesh A, MD  FEROSUL 325 (65 Fe) MG tablet Take 325 mg by mouth 2 (two) times daily. 02/28/21   [provider]  folic acid  (FOLVITE ) 1 MG tablet Take 1 tablet (1 mg total) by mouth daily. 01/21/24 03/21/24  ShahmehdiAdriana LABOR, MD  furosemide  (LASIX ) 40  MG tablet Take 40 mg by mouth 2 (two) times daily. 02/08/24   [provider]  levothyroxine  (SYNTHROID ) 25 MCG tablet Take 25 mcg by mouth daily.    [provider]  metolazone  (ZAROXOLYN ) 5 MG tablet Take 5 mg by mouth daily as needed (fluid, swelling). 03/22/23   [provider]  nystatin  (MYCOSTATIN /NYSTOP ) powder Apply topically 3 (three) times daily. 01/21/24   Shahmehdi, Adriana LABOR, MD  sertraline  (ZOLOFT ) 50 MG tablet Take 1 tablet by mouth daily. 01/13/23   [provider]  traZODone (DESYREL) 50 MG tablet Take 50 mg by mouth at bedtime. 02/08/24   [provider]  triamcinolone  (NASACORT  ALLERGY 24HR) 55 MCG/ACT AERO nasal inhaler Place 2 sprays into the nose daily.    [provider]    Allergies: Aspirin-caffeine, Tape, Toradol [ketorolac tromethamine], Etodolac, and Shrimp (diagnostic)    Review of Systems  All other systems reviewed and are negative.   Updated Vital Signs BP 133/76 (BP Location: Right Wrist)   Pulse 69   Temp 98.1 F (36.7 C) (Oral)   Resp 16   Ht 1.664 m (5' 5.5)   Wt 129.7 kg   SpO2 95%   BMI 46.87 kg/m   Physical Exam Vitals and nursing note reviewed.  Constitutional:      General: She is not in acute distress.    Appearance: She is well-developed.  HENT:     Head: Normocephalic and  atraumatic.     Mouth/Throat:     Pharynx: No oropharyngeal exudate.  Eyes:     General: No scleral icterus.       Right eye: No discharge.        Left eye: No discharge.     Conjunctiva/sclera: Conjunctivae normal.     Pupils: Pupils are equal, round, and reactive to light.  Neck:     Thyroid : No thyromegaly.     Vascular: No JVD.  Cardiovascular:     Rate and Rhythm: Normal rate and regular rhythm.     Heart sounds: Murmur heard.     No friction rub. No gallop.     Comments: Systolic heart murmur Pulmonary:     Effort: Pulmonary effort is normal. No respiratory distress.     Breath sounds: Normal breath  sounds. No wheezing or rales.  Abdominal:     General: Bowel sounds are normal. There is no distension.     Palpations: Abdomen is soft. There is no mass.     Tenderness: There is no abdominal tenderness.  Musculoskeletal:        General: No tenderness. Normal range of motion.     Cervical back: Normal range of motion and neck supple.     Right lower leg: Edema present.     Left lower leg: Edema present.  Lymphadenopathy:     Cervical: No cervical adenopathy.  Skin:    General: Skin is warm and dry.     Findings: No erythema or rash.  Neurological:     Mental Status: She is alert.     Coordination: Coordination normal.  Psychiatric:        Behavior: Behavior normal.     (all labs ordered are listed, but only abnormal results are displayed) Labs Reviewed  CBC WITH DIFFERENTIAL/PLATELET - Abnormal; Notable for the following components:      Result Value   RBC 2.99 (*)    Hemoglobin 9.8 (*)    HCT 30.5 (*)    MCV 102.0 (*)    All other components within normal limits  BASIC METABOLIC PANEL WITH GFR - Abnormal; Notable for the following components:   Sodium 134 (*)    Potassium 3.2 (*)    Calcium 8.6 (*)    All other components within normal limits  PRO BRAIN NATRIURETIC PEPTIDE - Abnormal; Notable for the following components:   Pro Brain Natriuretic Peptide 935.0 (*)    All other components within normal limits  MAGNESIUM    EKG: None  Radiology: No results found.   Procedures   Medications Ordered in the ED - No data to display                                  Medical Decision Making Amount and/or Complexity of Data Reviewed Labs: ordered. Radiology: ordered. ECG/medicine tests: ordered.    This patient presents to the ED for concern of increasing lab abnormalities, this involves an extensive number of treatment options, and is a complaint that carries with it a high risk of complications and morbidity.  The differential diagnosis includes heart  failure, liver failure, fluid retention, worsening heart failure   Co morbidities / Chronic conditions that complicate the patient evaluation  Morbid obesity, diabetes, congestive heart failure history   Additional history obtained:  Additional history obtained from EMR External records from outside source obtained and reviewed including medical record, see notes above  Lab Tests:  I Ordered, and personally interpreted labs.  The pertinent results include: BNP of 935 similar to what she had at her office but higher than what it was a couple of weeks ago, anemia chronic and stable in fact her hemoglobin level is higher than it had been suggesting that she is less diluted and more hemoconcentrated, metabolic panel shows preserved creatinine with no signs of dehydration, magnesium is normal   Imaging Studies ordered:  I ordered imaging studies including 2 view chest x-ray PA and lateral views I independently visualized and interpreted imaging which showed no acute pulm edema or infiltrate I agree with the radiologist interpretation   Cardiac Monitoring: / EKG:  The patient was maintained on a cardiac monitor.  I personally viewed and interpreted the cardiac monitored which showed an underlying rhythm of: Normal sinus rhythm   Problem List / ED Course / Critical interventions / Medication management  Patient is otherwise well-appearing, vitals reassuring, no hypoxia tachypnea tachycardia or hypotension, EKG unremarkable Discussed with cardiology, they are in agreement, ambulatory referral sent I have reviewed the patients home medicines and have made adjustments as needed   Consultations Obtained:  I requested consultation with the cardiologist Dr. Mallipeddi,  and discussed lab and imaging findings as well as pertinent plan - they recommend: Chest x-ray and follow-up in the office, if no significant pulmonary edema, no change in medications if no significant change in pulmonary  edema   Social Determinants of Health:  Morbid obesity   Test / Admission - Considered:  Stated admission but after discussion with cardiology they feel the patient can comfortably be handled in the office which I agree the patient is well-appearing and understands indications for return      Final diagnoses:  Elevated brain natriuretic peptide (BNP) level  Fluid retention  Acute on chronic diastolic CHF (congestive heart failure) [place])    ED Discharge Orders          Ordered    Ambulatory referral to Cardiology       Comments: New CHF diagnosis - needs eval in the office in next week   02/09/24 1536               Cleotilde Rogue, MD 02/09/24 2315

## 2024-02-09 NOTE — Discharge Instructions (Signed)
 I have looked at your blood work and your chest x-ray, there was no significant findings of concern, the BNP level was elevated at just over 900 however given your age this is barely outside the normal limit.  I did discuss your care with the cardiologist on-call and they do want to see you in the office, I have given you their phone number, please call today to make an appointment.  I have also placed a referral and hopefully the office will call you to get you in sooner.  Rest assured that your chest x-ray showed no significant amount of fluid on your lungs, you should continue to take your medications exactly as prescribed including the furosemide  and the metolazone , if you do develop severe or worsening symptoms including increasing shortness of breath, increasing swelling or significant weight gain or any other significant findings please return to the emergency department immediately.

## 2024-02-09 NOTE — Telephone Encounter (Signed)
 Patient aware of labs results. Patient is not having any shortness of breath and feels ok physically but she is very worried about what could be happening and is going to [facility] for further work up.

## 2024-03-05 ENCOUNTER — Encounter: Payer: Self-pay | Admitting: Radiology

## 2024-05-19 ENCOUNTER — Emergency Department (HOSPITAL_COMMUNITY)
Admission: EM | Admit: 2024-05-19 | Discharge: 2024-05-19 | Disposition: A | Discharge status: Home / Self Care | Payer: Self-pay

## 2024-05-19 ENCOUNTER — Encounter (HOSPITAL_COMMUNITY): Payer: Self-pay

## 2024-05-19 ENCOUNTER — Emergency Department (HOSPITAL_COMMUNITY): Payer: Self-pay

## 2024-05-19 DIAGNOSIS — Z7984 Long term (current) use of oral hypoglycemic drugs: Secondary | ICD-10-CM | POA: Insufficient documentation

## 2024-05-19 DIAGNOSIS — N281 Cyst of kidney, acquired: Secondary | ICD-10-CM | POA: Insufficient documentation

## 2024-05-19 DIAGNOSIS — R600 Localized edema: Secondary | ICD-10-CM | POA: Insufficient documentation

## 2024-05-19 DIAGNOSIS — G9341 Metabolic encephalopathy: Secondary | ICD-10-CM | POA: Insufficient documentation

## 2024-05-19 DIAGNOSIS — R7989 Other specified abnormal findings of blood chemistry: Secondary | ICD-10-CM

## 2024-05-19 DIAGNOSIS — R197 Diarrhea, unspecified: Secondary | ICD-10-CM | POA: Insufficient documentation

## 2024-05-19 DIAGNOSIS — E7220 Disorder of urea cycle metabolism, unspecified: Secondary | ICD-10-CM

## 2024-05-19 DIAGNOSIS — I110 Hypertensive heart disease with heart failure: Secondary | ICD-10-CM | POA: Insufficient documentation

## 2024-05-19 DIAGNOSIS — E860 Dehydration: Secondary | ICD-10-CM | POA: Insufficient documentation

## 2024-05-19 DIAGNOSIS — I5032 Chronic diastolic (congestive) heart failure: Secondary | ICD-10-CM | POA: Insufficient documentation

## 2024-05-19 DIAGNOSIS — E119 Type 2 diabetes mellitus without complications: Secondary | ICD-10-CM | POA: Insufficient documentation

## 2024-05-19 DIAGNOSIS — E870 Hyperosmolality and hypernatremia: Secondary | ICD-10-CM | POA: Insufficient documentation

## 2024-05-19 DIAGNOSIS — K760 Fatty (change of) liver, not elsewhere classified: Secondary | ICD-10-CM | POA: Insufficient documentation

## 2024-05-19 DIAGNOSIS — F17210 Nicotine dependence, cigarettes, uncomplicated: Secondary | ICD-10-CM | POA: Insufficient documentation

## 2024-05-19 LAB — URINALYSIS, ROUTINE W REFLEX MICROSCOPIC
Bilirubin Urine: NEGATIVE
Glucose, UA: NEGATIVE mg/dL
Hgb urine dipstick: NEGATIVE
Ketones, ur: NEGATIVE mg/dL
Leukocytes,Ua: NEGATIVE
Nitrite: NEGATIVE
Protein, ur: NEGATIVE mg/dL
Specific Gravity, Urine: 1.016 (ref 1.005–1.030)
pH: 7 (ref 5.0–8.0)

## 2024-05-19 LAB — LIPASE, BLOOD: Lipase: <10 U/L — ABNORMAL LOW (ref 11–51)

## 2024-05-19 LAB — LACTIC ACID, PLASMA
Lactic Acid, Venous: 1.9 mmol/L (ref 0.5–1.9)
Lactic Acid, Venous: 1.4 mmol/L (ref 0.5–1.9)

## 2024-05-19 LAB — CBC WITH DIFFERENTIAL/PLATELET
Abs Immature Granulocytes: 0.01 K/uL (ref 0.00–0.07)
Basophils Absolute: 0 K/uL (ref 0.0–0.1)
Basophils Relative: 1 %
Eosinophils Absolute: 0.1 K/uL (ref 0.0–0.5)
Eosinophils Relative: 3 %
HCT: 29.5 % — ABNORMAL LOW (ref 36.0–46.0)
Hemoglobin: 9.2 g/dL — ABNORMAL LOW (ref 12.0–15.0)
Immature Granulocytes: 0 %
Lymphocytes Relative: 25 %
Lymphs Abs: 1.1 K/uL (ref 0.7–4.0)
MCH: 30.3 pg (ref 26.0–34.0)
MCHC: 31.2 g/dL (ref 30.0–36.0)
MCV: 97 fL (ref 80.0–100.0)
Monocytes Absolute: 0.6 K/uL (ref 0.1–1.0)
Monocytes Relative: 14 %
Neutro Abs: 2.5 K/uL (ref 1.7–7.7)
Neutrophils Relative %: 57 %
Platelets: 140 K/uL — ABNORMAL LOW (ref 150–400)
RBC: 3.04 MIL/uL — ABNORMAL LOW (ref 3.87–5.11)
RDW: 19.8 % — ABNORMAL HIGH (ref 11.5–15.5)
WBC: 4.3 K/uL (ref 4.0–10.5)
nRBC: 0 % (ref 0.0–0.2)

## 2024-05-19 LAB — HEPATIC FUNCTION PANEL
ALT: 13 U/L (ref 0–44)
AST: 26 U/L (ref 15–41)
Albumin: 2.8 g/dL — ABNORMAL LOW (ref 3.5–5.0)
Alkaline Phosphatase: 83 U/L (ref 38–126)
Bilirubin, Direct: 1.5 mg/dL — ABNORMAL HIGH (ref 0.0–0.2)
Indirect Bilirubin: 0.9 mg/dL (ref 0.3–0.9)
Total Bilirubin: 2.4 mg/dL — ABNORMAL HIGH (ref 0.0–1.2)
Total Protein: 6.2 g/dL — ABNORMAL LOW (ref 6.5–8.1)

## 2024-05-19 LAB — BASIC METABOLIC PANEL WITH GFR
Anion gap: 11 (ref 5–15)
BUN: 13 mg/dL (ref 6–20)
CO2: 24 mmol/L (ref 22–32)
Calcium: 8.3 mg/dL — ABNORMAL LOW (ref 8.9–10.3)
Chloride: 114 mmol/L — ABNORMAL HIGH (ref 98–111)
Creatinine, Ser: 0.81 mg/dL (ref 0.44–1.00)
GFR, Estimated: >60 mL/min
Glucose, Bld: 76 mg/dL (ref 70–99)
Potassium: 3.5 mmol/L (ref 3.5–5.1)
Sodium: 149 mmol/L — ABNORMAL HIGH (ref 135–145)

## 2024-05-19 LAB — CLOSTRIDIUM DIFFICILE BY PCR, REFLEXED
Hypervirulent Strain: NEGATIVE
Toxigenic C. Difficile by PCR: POSITIVE — AB

## 2024-05-19 LAB — C DIFFICILE QUICK SCREEN W PCR REFLEX
C Diff antigen: POSITIVE — AB
C Diff toxin: NEGATIVE

## 2024-05-19 LAB — AMMONIA: Ammonia: 100 umol/L — ABNORMAL HIGH (ref 9–35)

## 2024-05-19 MED ORDER — IOHEXOL 300 MG/ML  SOLN
100.0000 mL | Freq: Once | INTRAMUSCULAR | Status: AC | PRN
  Administered 2024-05-19: 100 mL via INTRAVENOUS

## 2024-05-19 MED ORDER — LACTATED RINGERS IV BOLUS
1000.0000 mL | Freq: Once | INTRAVENOUS | Status: AC
  Administered 2024-05-19: 1000 mL via INTRAVENOUS

## 2024-05-19 MED ORDER — ONDANSETRON HCL 4 MG/2ML IJ SOLN
4.0000 mg | Freq: Once | INTRAMUSCULAR | Status: AC
  Administered 2024-05-19: 4 mg via INTRAVENOUS
  Filled 2024-05-19: qty 2

## 2024-05-19 MED ORDER — LACTULOSE 10 GM/15ML PO SOLN: 10.0000 g | Freq: Three times a day (TID) | ORAL | 0 refills | Status: DC

## 2024-05-19 NOTE — Consult Note (Addendum)
 "                                                                                       Patient Demographics  Lynn Andrade, is a 54 y.o. female   MRN: [id number]   DOB - 04/15/1971  Admit Date - 05/19/2024    Outpatient Primary MD for the patient is Samie Frederick, PA-C  Consult requested in the Hospital by No att. providers found, On 05/19/2024    Reason for consult : evaluate for hyperammonemia   With History of -  Past Medical History:  Diagnosis Date   Acid reflux    Adjustment disorder with depressed mood 10/27/2009   Formatting of this note might be different from the original. Overview:  Adjustment Disorder With Depressed Mood   Chronic congestive heart failure (HCC)    Diabetes mellitus with coincident hypertension (HCC) 01/13/2022   Diabetes mellitus without complication (HCC)    Edema    Essential hypertension 08/31/2010   Formatting of this note might be different from the original. Hypertension Formatting of this note might be different from the original. Overview:  Hypertension   Fluid retention    Hypertension       Past Surgical History:  Procedure Laterality Date   BILIOPANCREATIC DIVISION W DUODENAL SWITCH     CHOLECYSTECTOMY     DILATION AND CURETTAGE OF UTERUS     TONSILLECTOMY      in for   Chief Complaint  Patient presents with   Fatigue   Nausea     HPI  Lynn Andrade  is a 54 y.o. female, with past medical history of fatty liver, hyperammonemia, obesity status post bypass in 2018, [facility] CHF, patient presents to ED secondary to lethargy, increased sleepiness, over couple days, as well she does report she has been having some viral illness for which she has been having some nausea, vomiting and diarrhea for about a week and she has been feeling weak which prompted her to come to ED, she has history of C. difficile in the past and was concerned about that as well, but she denies any abdominal pain, fever or chills, and reports she held her  diuretics with her poor oral intake, reports she has history of bated ammonia in the past for which improved with lactulose , she has seen hepatology in 2024, by me reviewing her records it does appear she was supposed to get an MRI and follow-up but it has not been done since, so I have discussed these findings with her , she would like to go home today which I think is appropriate, I have discussed at length with her and her roommate about those findings, answered all her questions.    Review of Systems      A full 10 point Review of Systems was done, except as stated above, all other Review of Systems were negative.   Social History Social History   Tobacco Use   Smoking status: Every Day    Current packs/day: 1.00    Types: Cigarettes   Smokeless tobacco: Never  Substance Use Topics   Alcohol use: Yes    Family History Family  History  Problem Relation Age of Onset   Diabetes Mother    Hypertension Mother    Bladder Cancer Mother    Breast cancer Mother    Kidney cancer Mother    Diabetes Father    Colon cancer Neg Hx    Esophageal cancer Neg Hx      Prior to Admission medications  Medication Sig Start Date End Date Taking? Authorizing Provider  lactulose  (CHRONULAC ) 10 GM/15ML solution Take 15 mLs (10 g total) by mouth 3 (three) times daily. 05/19/24  Yes Jaymee Tilson, Brayton RAMAN, MD  ALBUTEROL  SULFATE HFA IN Inhale 2 Inhalations into the lungs as needed (for allergies).    [provider]  Cholecalciferol  1.25 MG (50000 UT) capsule Take 50,000 Units by mouth once a week. 12/28/23   [provider]  dapagliflozin  propanediol (FARXIGA ) 10 MG TABS tablet TAKE 1 TABLET BY MOUTH ONCE DAILY BEFORE BREAKFAST 05/17/22   Chandrasekhar, Mahesh A, MD  FEROSUL 325 (65 Fe) MG tablet Take 325 mg by mouth 2 (two) times daily. 02/28/21   [provider]  furosemide  (LASIX ) 40 MG tablet Take 40 mg by mouth 2 (two) times daily. 02/08/24   [provider]   levothyroxine  (SYNTHROID ) 25 MCG tablet Take 25 mcg by mouth daily.    [provider]  metolazone  (ZAROXOLYN ) 5 MG tablet Take 5 mg by mouth daily as needed (fluid, swelling). 03/22/23   [provider]  nystatin  (MYCOSTATIN /NYSTOP ) powder Apply topically 3 (three) times daily. 01/21/24   Shahmehdi, Adriana LABOR, MD  sertraline  (ZOLOFT ) 50 MG tablet Take 1 tablet by mouth daily. 01/13/23   [provider]  traZODone (DESYREL) 50 MG tablet Take 50 mg by mouth at bedtime. 02/08/24   [provider]  triamcinolone  (NASACORT  ALLERGY 24HR) 55 MCG/ACT AERO nasal inhaler Place 2 sprays into the nose daily.    [provider]    Anti-infectives (From admission, onward)    None       Scheduled Meds: Continuous Infusions: PRN Meds:.  Allergies[1]  Physical Exam  Vitals  Blood pressure (!) 123/58, pulse 89, temperature 97.7 F (36.5 C), temperature source Oral, resp. rate 16, height 5' 6 (1.676 m), SpO2 99%.   1. General Alert female, laying in bed, no apparent distress  2. Normal affect and insight, Not Suicidal or Homicidal, Awake Alert, Oriented X 3.  3. No F.N deficits, ALL C.Nerves Intact, Strength 5/5 all 4 extremities, Sensation intact all 4 extremities, Plantars down going.  4. Ears and Eyes appear Normal, Conjunctivae clear, PERRLA. Moist Oral Mucosa.  5. Supple Neck, No JVD, No cervical lymphadenopathy appriciated, No Carotid Bruits.  6. Symmetrical Chest wall movement, Good air movement bilaterally, CTAB.  7. RRR, No Gallops, Rubs or Murmurs, No Parasternal Heave.  8. Positive Bowel Sounds, Abdomen Soft, No tenderness, No organomegaly appriciated,No rebound -guarding or rigidity.  9.  No Cyanosis, Normal Skin Turgor, No Skin Rash or Bruise.  10. Good muscle tone,  joints appear normal , no effusions, Normal ROM.    CBC Recent Labs  Lab 05/19/24 1324  WBC 4.3  HGB 9.2*  HCT 29.5*  PLT 140*  MCV 97.0  MCH 30.3  MCHC  31.2  RDW 19.8*  LYMPHSABS 1.1  MONOABS 0.6  EOSABS 0.1  BASOSABS 0.0   ------------------------------------------------------------------------------------------------------------------  Chemistries  Recent Labs  Lab 05/19/24 1503  NA 149*  K 3.5  CL 114*  CO2 24  GLUCOSE 76  BUN 13  CREATININE 0.81  CALCIUM  8.3*  AST 26  ALT 13  ALKPHOS 83  BILITOT 2.4*   ------------------------------------------------------------------------------------------------------------------ CrCl cannot be calculated (Unknown ideal weight.). ------------------------------------------------------------------------------------------------------------------ No results for input(s): TSH, T4TOTAL, T3FREE, THYROIDAB in the last 72 hours.  Invalid input(s): FREET3   Coagulation profile No results for input(s): INR, PROTIME in the last 168 hours. ------------------------------------------------------------------------------------------------------------------- No results for input(s): DDIMER in the last 72 hours. -------------------------------------------------------------------------------------------------------------------  Cardiac Enzymes No results for input(s): CKMB, TROPONINI, MYOGLOBIN in the last 168 hours.  Invalid input(s): CK ------------------------------------------------------------------------------------------------------------------ Invalid input(s): POCBNP   ---------------------------------------------------------------------------------------------------------------  Urinalysis    Component Value Date/Time   COLORURINE AMBER (A) 05/19/2024 1236   APPEARANCEUR CLEAR 05/19/2024 1236   LABSPEC 1.016 05/19/2024 1236   PHURINE 7.0 05/19/2024 1236   GLUCOSEU NEGATIVE 05/19/2024 1236   HGBUR NEGATIVE 05/19/2024 1236   BILIRUBINUR NEGATIVE 05/19/2024 1236   BILIRUBINUR small (A) 11/10/2021 1716   KETONESUR NEGATIVE 05/19/2024 1236   PROTEINUR  NEGATIVE 05/19/2024 1236   UROBILINOGEN 2.0 (A) 11/10/2021 1716   NITRITE NEGATIVE 05/19/2024 1236   LEUKOCYTESUR NEGATIVE 05/19/2024 1236     Imaging results:   CT ABDOMEN PELVIS W CONTRAST Result Date: 05/19/2024 CLINICAL DATA:  Abdominal pain, vomiting, and diarrhea.  Lethargy. EXAM: CT ABDOMEN AND PELVIS WITH CONTRAST TECHNIQUE: Multidetector CT imaging of the abdomen and pelvis was performed using the standard protocol following bolus administration of intravenous contrast. RADIATION DOSE REDUCTION: This exam was performed according to the departmental dose-optimization program which includes automated exposure control, adjustment of the mA and/or kV according to patient size and/or use of iterative reconstruction technique. CONTRAST:  OMNIPAQUE  IOHEXOL  300 MG/ML  SOLN COMPARISON:  None Available. FINDINGS: Lower chest: No acute abnormality. Hepatobiliary: A small cystic tubular structure is noted in the left lobe of the liver measuring 1.1 cm. Fatty infiltration of the liver is noted. The gallbladder is surgically absent. No extrahepatic biliary ductal dilatation. Pancreas: Unremarkable. No pancreatic ductal dilatation or surrounding inflammatory changes. Spleen: The spleen is borderline enlarged measuring 13 cm in length. Adrenals/Urinary Tract: The adrenal glands are within normal limits. The kidneys enhance symmetrically. A slightly complex exophytic lesion is noted in the upper pole of the right kidney measuring 1.5 cm. No renal calculus or hydronephrosis bilaterally. The bladder is unremarkable. Stomach/Bowel: There is a small hiatal hernia. Gastric surgical changes are noted. No bowel obstruction, free air, or pneumatosis is seen. Appendix is not seen. Vascular/Lymphatic: Aortic atherosclerosis. Multiple varices are noted in the right upper and lower quadrants. There is a nonspecific prominent lymph node along the external iliac chain on the right measuring 1.2 cm. Reproductive: Status  post hysterectomy. No adnexal masses. Other: No abdominopelvic ascites.  Diffuse anasarca is noted. Musculoskeletal: Degenerative changes are present in the thoracolumbar spine. No acute osseous abnormality. IMPRESSION: 1. No acute intra-abdominal process. 2. Small hiatal hernia and gastric surgery changes. 3. Hepatic steatosis. A linear tubular structure is noted in the left lobe of the liver, possible cyst versus focal biliary ductal dilatation. 4. Slightly complex lesion in the upper pole of the right kidney. MRI is suggested for further evaluation. 5. Mild splenomegaly and varices. 6. Anasarca. 7. Aortic atherosclerosis. Electronically Signed   By: Leita Birmingham M.D.   On: 05/19/2024 16:46      Assessment & Plan  Active Problems:   Hyperammonemia  Hyperammonemia Altered mental status>> acute metabolic encephalopathy Fatty liver -She presents with transient confusion, she is at baseline upon my evaluation, she has history of hyperammonemia in the past where she  has been followed with hepatologist with plan for repeat MRI and possible biopsy, for unclear reason she did not get the MRI or the follow-up appointment, have instructed her to follow-up with her hepatologist Dr.Tilak baba guarding further workup. - Her mentation is currently back at baseline, I have entered a prescription for lactulose  10 g 3 times daily and instructed her to adjust dosing for 2-3 BMs per day.  Lower extremity edema - This appears chronic, reports it is better than baseline, I have asked her to keep leg elevated, and follow with lymphedema clinic.  Chronic diastolic CHF -Instructed her to hold Lasix , Zaroxolyn  and Jardiance for the next 3 to 5 days given she has been having viral illness with poor appetite and diarrhea and to resume once oral intake has picked up. (I have asked her to start taking her lactulose  and adjust dose as needed, but to hold it if she is having significant diarrhea over the next few  days)  Diarrhea - C. difficile antigen is positive but toxin is negative, this is most likely related to viral illness, not C. difficile, no indication to treat, I have encouraged her to increase her oral fluid intake and have her hold her diuresis.  Right kidney cystic lesion - Appears on previous imaging on [facility], but looks more complex I have discussed these findings with the patient, he is aware that she will need repeat MRI for further evaluation  Hypernatremia - Instructed her to hold her salt tablets for next few days, and to hold diuresis till her oral intake has improved.  Can be discharged home today, with recommendation to follow with her PCP, to follow with her hepatologist, repeat labs next week, to start lactulose , and to hold her Lasix  and Jardiance for few days till her viral illness improves.     Family Communication: Plan discussed with patient and her roommate at bedside, all their questions have been answered.   Thank you for the consult  Brayton Lye M.D on 05/19/2024 at 6:51 PM         [1]  Allergies Allergen Reactions   Aspirin-Caffeine Hives and Swelling   Tape Dermatitis    Paper tape is okay   Toradol [Ketorolac Tromethamine] Other (See Comments)    Unknown    Etodolac Hives   Shrimp (Diagnostic) Hives   "

## 2024-05-19 NOTE — ED Provider Notes (Signed)
 " [place] EMERGENCY DEPARTMENT AT [facility] Provider Note   CSN: [id number] Arrival date & time: 05/19/24  1227     Patient presents with: Fatigue and Nausea   Lynn Andrade is a 54 y.o. female.   54 year old female presents for evaluation of not feeling well.  States she is very fatigued and lethargic.  States she has had nausea vomiting and diarrhea for about a week now and has been very weak and had difficulty getting around.  States she has had a history of C. difficile before.  She also states that she has had this happen before and no one can figure it out.  Denies any other symptoms or concerns.        Prior to Admission medications  Medication Sig Start Date End Date Taking? Authorizing Provider  ALBUTEROL  SULFATE HFA IN Inhale 2 Inhalations into the lungs as needed (for allergies).    [provider]  Cholecalciferol  1.25 MG (50000 UT) capsule Take 50,000 Units by mouth once a week. 12/28/23   [provider]  dapagliflozin  propanediol (FARXIGA ) 10 MG TABS tablet TAKE 1 TABLET BY MOUTH ONCE DAILY BEFORE BREAKFAST 05/17/22   Chandrasekhar, Mahesh A, MD  FEROSUL 325 (65 Fe) MG tablet Take 325 mg by mouth 2 (two) times daily. 02/28/21   [provider]  furosemide  (LASIX ) 40 MG tablet Take 40 mg by mouth 2 (two) times daily. 02/08/24   [provider]  levothyroxine  (SYNTHROID ) 25 MCG tablet Take 25 mcg by mouth daily.    [provider]  metolazone  (ZAROXOLYN ) 5 MG tablet Take 5 mg by mouth daily as needed (fluid, swelling). 03/22/23   [provider]  nystatin  (MYCOSTATIN /NYSTOP ) powder Apply topically 3 (three) times daily. 01/21/24   Shahmehdi, Adriana LABOR, MD  sertraline  (ZOLOFT ) 50 MG tablet Take 1 tablet by mouth daily. 01/13/23   [provider]  traZODone (DESYREL) 50 MG tablet Take 50 mg by mouth at bedtime. 02/08/24   [provider]  triamcinolone  (NASACORT  ALLERGY 24HR) 55 MCG/ACT AERO  nasal inhaler Place 2 sprays into the nose daily.    [provider]    Allergies: Aspirin-caffeine, Tape, Toradol [ketorolac tromethamine], Etodolac, and Shrimp (diagnostic)    Review of Systems  Constitutional:  Positive for fatigue. Negative for chills and fever.  HENT:  Negative for ear pain and sore throat.   Eyes:  Negative for pain and visual disturbance.  Respiratory:  Negative for cough and shortness of breath.   Cardiovascular:  Negative for chest pain and palpitations.  Gastrointestinal:  Positive for abdominal pain, diarrhea, nausea and vomiting.  Genitourinary:  Negative for dysuria and hematuria.  Musculoskeletal:  Negative for arthralgias and back pain.  Skin:  Negative for color change and rash.  Neurological:  Negative for seizures and syncope.  All other systems reviewed and are negative.   Updated Vital Signs BP (!) 132/53   Pulse 81   Temp 97.7 F (36.5 C) (Oral)   Resp 17   Ht 5' 6 (1.676 m)   SpO2 98%   BMI 46.16 kg/m   Physical Exam Vitals and nursing note reviewed.  Constitutional:      General: She is not in acute distress.    Appearance: Normal appearance. She is well-developed. She is obese. She is ill-appearing.  HENT:     Head: Normocephalic and atraumatic.  Eyes:     Conjunctiva/sclera: Conjunctivae normal.  Cardiovascular:     Rate and Rhythm: Normal rate and  regular rhythm.     Pulses: Normal pulses.     Heart sounds: Normal heart sounds. No murmur heard. Pulmonary:     Effort: Pulmonary effort is normal. No respiratory distress.     Breath sounds: Normal breath sounds.  Abdominal:     General: There is distension.     Palpations: Abdomen is soft.     Tenderness: There is no abdominal tenderness.  Musculoskeletal:        General: No swelling.     Cervical back: Neck supple.  Skin:    General: Skin is warm and dry.     Capillary Refill: Capillary refill takes less than 2 seconds.     Coloration: Skin is pale.   Neurological:     General: No focal deficit present.     Mental Status: She is alert.  Psychiatric:        Mood and Affect: Mood normal.     (all labs ordered are listed, but only abnormal results are displayed) Labs Reviewed  URINALYSIS, ROUTINE W REFLEX MICROSCOPIC - Abnormal; Notable for the following components:      Result Value   Color, Urine AMBER (*)    All other components within normal limits  CBC WITH DIFFERENTIAL/PLATELET - Abnormal; Notable for the following components:   RBC 3.04 (*)    Hemoglobin 9.2 (*)    HCT 29.5 (*)    RDW 19.8 (*)    Platelets 140 (*)    All other components within normal limits  AMMONIA - Abnormal; Notable for the following components:   Ammonia 100 (*)    All other components within normal limits  C DIFFICILE QUICK SCREEN W PCR REFLEX    GASTROINTESTINAL PANEL BY PCR, STOOL (REPLACES STOOL CULTURE)  LACTIC ACID, PLASMA  LACTIC ACID, PLASMA  BASIC METABOLIC PANEL WITH GFR  HEPATIC FUNCTION PANEL  LIPASE, BLOOD    EKG: EKG Interpretation Date/Time:  Saturday May 19 2024 12:53:59 EST Ventricular Rate:  81 PR Interval:  147 QRS Duration:  98 QT Interval:  415 QTC Calculation: 482 R Axis:   97  Text Interpretation: Sinus rhythm Borderline right axis deviation Low voltage, precordial leads Compared with prior EKG from 02/09/2024 Confirmed by Gennaro Bouchard ([id number]) on 05/19/2024 1:00:17 PM  Radiology: No results found.   Procedures   Medications Ordered in the ED  lactated ringers  bolus 1,000 mL (1,000 mLs Intravenous New Bag/Given 05/19/24 1346)  ondansetron  (ZOFRAN ) injection 4 mg (4 mg Intravenous Given 05/19/24 1346)                                    Medical Decision Making Cardiac monitor interpretation: sinus rhythm, no ectopy   54 year old female here for diarrhea and generally feeling unwell.  Has had diarrhea and vomiting for a week.  Has had a history of C. difficile before but she has not been on antibiotics  recently.  Blood pressure in the 90s systolic.  Was given 1 L of IV fluids.  Ammonia is elevated at 100.  Patient has no history of liver disease.  Remainder of her labs and imaging are pending at this time. Patient signed out to oncoming provider at 3 PM pending remainder of workup and ultimate disposition.   Problems Addressed: Dehydration: undiagnosed new problem with uncertain prognosis Diarrhea, unspecified type: undiagnosed new problem with uncertain prognosis Increased ammonia level: undiagnosed new problem with uncertain prognosis  Amount and/or  Complexity of Data Reviewed External Data Reviewed: notes.    Details: Prior ED records reviewed and patient seen on 02/09/2024 for elevated pro-BNP level  Labs: ordered. Decision-making details documented in ED Course.    Details: Ordered and pending, ammonia is 100 Radiology: ordered and independent interpretation performed. Decision-making details documented in ED Course.    Details: Ordered and pending Discussion of management or test interpretation with external provider(s): Ordered and interpreted by me in the absence of cardiology and shows sinus rhythm, no STEMI, or significant change when compared to prior EKG  Risk OTC drugs. Prescription drug management. Drug therapy requiring intensive monitoring for toxicity. Decision regarding hospitalization.     Final diagnoses:  Diarrhea, unspecified type  Dehydration  Increased ammonia level    ED Discharge Orders     None          Gennaro Duwaine CROME, DO 05/19/24 1509  "

## 2024-05-19 NOTE — ED Notes (Signed)
 Contacted lab due to some results not showing. Per lab sample was hemolyzed. Lab did not notify of same. Samples have been recollected and are currently in process.

## 2024-05-19 NOTE — Discharge Instructions (Signed)
-   Take lactulose  10 g oral 3 times daily, and adjust dosing to target 2-3 semisolid BMs per day. -Please hold sodium tablets, Lasix , Farxiga  and metolazone  for next 3 to 5 days until diarrhea resolves and oral intake improves -Please schedule schedule an appointment with your primary care doctor for repeat labs in 1 week, and follow-up with your PCP regarding right renal upper pole cyst

## 2024-05-19 NOTE — ED Provider Notes (Signed)
 Signout from Dr. Gennaro.  54 year old female here with malaise lethargy nausea vomiting diarrhea.  History of C. difficile.  Labs showing some slightly worsening of LFTs.  C. difficile antigen positive but toxin negative.  Ammonia significantly elevated.  Pending CT abdomen and pelvis.  Disposition per results testing. Physical Exam  BP (!) 132/53   Pulse 81   Temp 97.7 F (36.5 C) (Oral)   Resp 17   Ht 5' 6 (1.676 m)   SpO2 98%   BMI 46.16 kg/m   Physical Exam  Procedures  Procedures  ED Course / MDM    Medical Decision Making Amount and/or Complexity of Data Reviewed Labs: ordered. Radiology: ordered.  Risk Prescription drug management.   1715.  Patient is awake and alert and feels otherwise pretty well.  I explained that her CT was fairly stable but her ammonia was elevated.  She said this happened before and she had to take lactulose .  I reviewed this with [facility] hospitalist Dr. Sherlon.  He is going to see in consult to see if it is appropriate that she go home on some lactulose  and outpatient follow-up with GI.  Will get back to me with his recommendations.  1751.  Patient evaluated by Dr. Sherlon and he has made some adjustments to her medication.  Patient is comfortable plan for discharge and outpatient follow-up with PCP and also given referral to GI.  Return instructions discussed     Towana Ozell BROCKS, MD 05/20/24 [phone]

## 2024-05-19 NOTE — ED Triage Notes (Signed)
 Pt comes in for lethargic, n/v. N/v started last night. Pt woke up lethargic and fell asleep at work.    Pt has a hx of these episodes and never dx. Pt has a hx of c. Diff. No recent new abx

## 2024-05-20 LAB — GASTROINTESTINAL PANEL BY PCR, STOOL (REPLACES STOOL CULTURE)

## 2024-05-28 ENCOUNTER — Encounter (HOSPITAL_COMMUNITY): Payer: Self-pay

## 2024-05-28 ENCOUNTER — Emergency Department (HOSPITAL_COMMUNITY): Payer: Self-pay

## 2024-05-28 ENCOUNTER — Other Ambulatory Visit: Payer: Self-pay

## 2024-05-28 ENCOUNTER — Inpatient Hospital Stay (HOSPITAL_COMMUNITY)
Admission: EM | Admit: 2024-05-28 | Discharge: 2024-05-31 | DRG: 442 | Disposition: A | Discharge status: Home / Self Care | Payer: Self-pay | Attending: Family Medicine | Admitting: Family Medicine

## 2024-05-28 DIAGNOSIS — Z8052 Family history of malignant neoplasm of bladder: Secondary | ICD-10-CM

## 2024-05-28 DIAGNOSIS — I110 Hypertensive heart disease with heart failure: Secondary | ICD-10-CM | POA: Diagnosis present

## 2024-05-28 DIAGNOSIS — K219 Gastro-esophageal reflux disease without esophagitis: Secondary | ICD-10-CM | POA: Diagnosis present

## 2024-05-28 DIAGNOSIS — G4733 Obstructive sleep apnea (adult) (pediatric): Secondary | ICD-10-CM | POA: Diagnosis present

## 2024-05-28 DIAGNOSIS — E119 Type 2 diabetes mellitus without complications: Secondary | ICD-10-CM | POA: Diagnosis present

## 2024-05-28 DIAGNOSIS — Z79890 Hormone replacement therapy (postmenopausal): Secondary | ICD-10-CM

## 2024-05-28 DIAGNOSIS — K7030 Alcoholic cirrhosis of liver without ascites: Secondary | ICD-10-CM | POA: Diagnosis present

## 2024-05-28 DIAGNOSIS — Z8051 Family history of malignant neoplasm of kidney: Secondary | ICD-10-CM

## 2024-05-28 DIAGNOSIS — Z79899 Other long term (current) drug therapy: Secondary | ICD-10-CM

## 2024-05-28 DIAGNOSIS — Z8249 Family history of ischemic heart disease and other diseases of the circulatory system: Secondary | ICD-10-CM

## 2024-05-28 DIAGNOSIS — E7220 Disorder of urea cycle metabolism, unspecified: Secondary | ICD-10-CM | POA: Diagnosis present

## 2024-05-28 DIAGNOSIS — F10129 Alcohol abuse with intoxication, unspecified: Secondary | ICD-10-CM | POA: Diagnosis present

## 2024-05-28 DIAGNOSIS — T59891A Toxic effect of other specified gases, fumes and vapors, accidental (unintentional), initial encounter: Secondary | ICD-10-CM | POA: Diagnosis present

## 2024-05-28 DIAGNOSIS — E039 Hypothyroidism, unspecified: Secondary | ICD-10-CM | POA: Diagnosis present

## 2024-05-28 DIAGNOSIS — D61818 Other pancytopenia: Secondary | ICD-10-CM | POA: Diagnosis present

## 2024-05-28 DIAGNOSIS — Z833 Family history of diabetes mellitus: Secondary | ICD-10-CM

## 2024-05-28 DIAGNOSIS — K7682 Hepatic encephalopathy: Principal | ICD-10-CM | POA: Diagnosis present

## 2024-05-28 DIAGNOSIS — I10 Essential (primary) hypertension: Secondary | ICD-10-CM | POA: Diagnosis present

## 2024-05-28 DIAGNOSIS — F17210 Nicotine dependence, cigarettes, uncomplicated: Secondary | ICD-10-CM | POA: Diagnosis present

## 2024-05-28 DIAGNOSIS — I509 Heart failure, unspecified: Secondary | ICD-10-CM | POA: Diagnosis present

## 2024-05-28 DIAGNOSIS — Z6841 Body Mass Index (BMI) 40.0 and over, adult: Secondary | ICD-10-CM

## 2024-05-28 DIAGNOSIS — Z803 Family history of malignant neoplasm of breast: Secondary | ICD-10-CM

## 2024-05-28 DIAGNOSIS — K700 Alcoholic fatty liver: Secondary | ICD-10-CM | POA: Diagnosis present

## 2024-05-28 LAB — LACTIC ACID, PLASMA
Lactic Acid, Venous: 2 mmol/L (ref 0.5–1.9)
Lactic Acid, Venous: 1.3 mmol/L (ref 0.5–1.9)

## 2024-05-28 LAB — URINALYSIS, W/ REFLEX TO CULTURE (INFECTION SUSPECTED)
Bacteria, UA: NONE SEEN
Bilirubin Urine: NEGATIVE
Glucose, UA: NEGATIVE mg/dL
Hgb urine dipstick: NEGATIVE
Ketones, ur: NEGATIVE mg/dL
Leukocytes,Ua: NEGATIVE
Nitrite: NEGATIVE
Protein, ur: NEGATIVE mg/dL
Specific Gravity, Urine: 1.015 (ref 1.005–1.030)
pH: 7 (ref 5.0–8.0)

## 2024-05-28 LAB — CBC WITH DIFFERENTIAL/PLATELET
Abs Immature Granulocytes: 0.02 10*3/uL (ref 0.00–0.07)
Basophils Absolute: 0 10*3/uL (ref 0.0–0.1)
Basophils Relative: 0 %
Eosinophils Absolute: 0 10*3/uL (ref 0.0–0.5)
Eosinophils Relative: 0 %
HCT: 29.5 % — ABNORMAL LOW (ref 36.0–46.0)
Hemoglobin: 9.7 g/dL — ABNORMAL LOW (ref 12.0–15.0)
Immature Granulocytes: 0 %
Lymphocytes Relative: 6 %
Lymphs Abs: 0.5 10*3/uL — ABNORMAL LOW (ref 0.7–4.0)
MCH: 31 pg (ref 26.0–34.0)
MCHC: 32.9 g/dL (ref 30.0–36.0)
MCV: 94.2 fL (ref 80.0–100.0)
Monocytes Absolute: 0.6 10*3/uL (ref 0.1–1.0)
Monocytes Relative: 9 %
Neutro Abs: 6.2 10*3/uL (ref 1.7–7.7)
Neutrophils Relative %: 85 %
Platelets: 136 10*3/uL — ABNORMAL LOW (ref 150–400)
RBC: 3.13 MIL/uL — ABNORMAL LOW (ref 3.87–5.11)
RDW: 17.7 % — ABNORMAL HIGH (ref 11.5–15.5)
WBC: 7.3 10*3/uL (ref 4.0–10.5)
nRBC: 0 % (ref 0.0–0.2)

## 2024-05-28 LAB — COMPREHENSIVE METABOLIC PANEL WITH GFR
ALT: 20 U/L (ref 0–44)
AST: 40 U/L (ref 15–41)
Albumin: 3.2 g/dL — ABNORMAL LOW (ref 3.5–5.0)
Alkaline Phosphatase: 89 U/L (ref 38–126)
Anion gap: 14 (ref 5–15)
BUN: 15 mg/dL (ref 6–20)
CO2: 24 mmol/L (ref 22–32)
Calcium: 8.7 mg/dL — ABNORMAL LOW (ref 8.9–10.3)
Chloride: 106 mmol/L (ref 98–111)
Creatinine, Ser: 0.98 mg/dL (ref 0.44–1.00)
GFR, Estimated: >60 mL/min
Glucose, Bld: 106 mg/dL — ABNORMAL HIGH (ref 70–99)
Potassium: 4.3 mmol/L (ref 3.5–5.1)
Sodium: 144 mmol/L (ref 135–145)
Total Bilirubin: 2.5 mg/dL — ABNORMAL HIGH (ref 0.0–1.2)
Total Protein: 6.9 g/dL (ref 6.5–8.1)

## 2024-05-28 LAB — AMMONIA: Ammonia: 199 umol/L — ABNORMAL HIGH (ref 9–35)

## 2024-05-28 LAB — URINE DRUG SCREEN
Amphetamines: NEGATIVE
Barbiturates: NEGATIVE
Benzodiazepines: NEGATIVE
Cocaine: NEGATIVE
Fentanyl: NEGATIVE
Methadone Scn, Ur: NEGATIVE
Opiates: NEGATIVE
Tetrahydrocannabinol: NEGATIVE

## 2024-05-28 LAB — ETHANOL: Alcohol, Ethyl (B): <15 mg/dL

## 2024-05-28 LAB — GLUCOSE, CAPILLARY
Glucose-Capillary: 80 mg/dL (ref 70–99)
Glucose-Capillary: 79 mg/dL (ref 70–99)

## 2024-05-28 LAB — PRO BRAIN NATRIURETIC PEPTIDE: Pro Brain Natriuretic Peptide: 687 pg/mL — ABNORMAL HIGH

## 2024-05-28 LAB — ACETAMINOPHEN LEVEL: Acetaminophen (Tylenol), Serum: <10 ug/mL — ABNORMAL LOW (ref 10–30)

## 2024-05-28 LAB — PROTIME-INR
INR: 1.2 (ref 0.8–1.2)
Prothrombin Time: 15.6 s — ABNORMAL HIGH (ref 11.4–15.2)

## 2024-05-28 MED ORDER — SODIUM CHLORIDE 0.9% FLUSH: 3.0000 mL | Freq: Two times a day (BID) | INTRAVENOUS | Status: DC

## 2024-05-28 MED ORDER — LACTULOSE ENEMA
300.0000 mL | Freq: Once | ORAL | Status: AC
  Administered 2024-05-28: 300 mL via RECTAL
  Filled 2024-05-28: qty 300

## 2024-05-28 MED ORDER — LEVOTHYROXINE SODIUM 25 MCG PO TABS
25.0000 ug | ORAL_TABLET | Freq: Every day | ORAL | Status: DC
  Administered 2024-05-29 – 2024-05-31 (×3): 25 ug via ORAL
  Filled 2024-05-28 (×3): qty 1

## 2024-05-28 MED ORDER — PANTOPRAZOLE SODIUM 40 MG IV SOLR
40.0000 mg | INTRAVENOUS | Status: DC
  Administered 2024-05-28 – 2024-05-29 (×2): 40 mg via INTRAVENOUS
  Filled 2024-05-28 (×2): qty 10

## 2024-05-28 MED ORDER — LORAZEPAM 2 MG/ML IJ SOLN
1.0000 mg | Freq: Two times a day (BID) | INTRAMUSCULAR | Status: DC | PRN
  Administered 2024-05-28: 1 mg via INTRAVENOUS
  Filled 2024-05-28: qty 1

## 2024-05-28 MED ORDER — POLYETHYLENE GLYCOL 3350 17 G PO PACK: 17.0000 g | PACK | Freq: Every day | ORAL | Status: DC | PRN

## 2024-05-28 MED ORDER — SODIUM CHLORIDE 0.9 % IV SOLN: INTRAVENOUS | Status: AC | PRN

## 2024-05-28 MED ORDER — SODIUM CHLORIDE 0.9% FLUSH: 3.0000 mL | INTRAVENOUS | Status: DC | PRN

## 2024-05-28 MED ORDER — LORAZEPAM 2 MG/ML IJ SOLN: 1.0000 mg | INTRAMUSCULAR | Status: DC | PRN

## 2024-05-28 MED ORDER — INSULIN ASPART 100 UNIT/ML IJ SOLN: 0.0000 [IU] | Freq: Every day | INTRAMUSCULAR | Status: DC

## 2024-05-28 MED ORDER — ONDANSETRON HCL 4 MG PO TABS: 4.0000 mg | ORAL_TABLET | Freq: Four times a day (QID) | ORAL | Status: DC | PRN

## 2024-05-28 MED ORDER — SODIUM CHLORIDE 0.9 % IV BOLUS
1000.0000 mL | Freq: Once | INTRAVENOUS | Status: AC
  Administered 2024-05-28: 1000 mL via INTRAVENOUS

## 2024-05-28 MED ORDER — TRAZODONE HCL 50 MG PO TABS
50.0000 mg | ORAL_TABLET | Freq: Every evening | ORAL | Status: DC | PRN
  Administered 2024-05-29 – 2024-05-30 (×2): 50 mg via ORAL
  Filled 2024-05-28 (×2): qty 1

## 2024-05-28 MED ORDER — BISACODYL 10 MG RE SUPP: 10.0000 mg | Freq: Every day | RECTAL | Status: DC | PRN

## 2024-05-28 MED ORDER — FOLIC ACID 1 MG PO TABS
1.0000 mg | ORAL_TABLET | Freq: Every day | ORAL | Status: DC
  Administered 2024-05-28 – 2024-05-31 (×4): 1 mg via ORAL
  Filled 2024-05-28 (×4): qty 1

## 2024-05-28 MED ORDER — ACETAMINOPHEN 325 MG PO TABS
650.0000 mg | ORAL_TABLET | Freq: Four times a day (QID) | ORAL | Status: DC | PRN
  Administered 2024-05-29 – 2024-05-31 (×4): 650 mg via ORAL
  Filled 2024-05-28 (×4): qty 2

## 2024-05-28 MED ORDER — INSULIN ASPART 100 UNIT/ML IJ SOLN: 0.0000 [IU] | Freq: Three times a day (TID) | INTRAMUSCULAR | Status: DC

## 2024-05-28 MED ORDER — ACETAMINOPHEN 650 MG RE SUPP: 650.0000 mg | Freq: Four times a day (QID) | RECTAL | Status: DC | PRN

## 2024-05-28 MED ORDER — THIAMINE HCL 100 MG/ML IJ SOLN
100.0000 mg | Freq: Every day | INTRAMUSCULAR | Status: DC
  Administered 2024-05-29 – 2024-05-30 (×2): 100 mg via INTRAVENOUS
  Filled 2024-05-28 (×3): qty 2

## 2024-05-28 MED ORDER — ONDANSETRON HCL 4 MG/2ML IJ SOLN
4.0000 mg | Freq: Four times a day (QID) | INTRAMUSCULAR | Status: DC | PRN
  Administered 2024-05-30: 4 mg via INTRAVENOUS
  Filled 2024-05-28: qty 2

## 2024-05-28 MED ORDER — THIAMINE MONONITRATE 100 MG PO TABS
100.0000 mg | ORAL_TABLET | Freq: Every day | ORAL | Status: DC
  Administered 2024-05-28 – 2024-05-31 (×2): 100 mg via ORAL
  Filled 2024-05-28 (×3): qty 1

## 2024-05-28 MED ORDER — ALBUTEROL SULFATE (2.5 MG/3ML) 0.083% IN NEBU
2.5000 mg | INHALATION_SOLUTION | RESPIRATORY_TRACT | Status: DC | PRN
  Administered 2024-05-30: 2.5 mg via RESPIRATORY_TRACT
  Filled 2024-05-28: qty 3

## 2024-05-28 MED ORDER — LORAZEPAM 1 MG PO TABS: 1.0000 mg | ORAL_TABLET | ORAL | Status: DC | PRN

## 2024-05-28 MED ORDER — FERROUS SULFATE 325 (65 FE) MG PO TABS
325.0000 mg | ORAL_TABLET | Freq: Every day | ORAL | Status: DC
  Administered 2024-05-29 – 2024-05-31 (×3): 325 mg via ORAL
  Filled 2024-05-28 (×3): qty 1

## 2024-05-28 MED ORDER — LACTULOSE 10 GM/15ML PO SOLN
20.0000 g | Freq: Three times a day (TID) | ORAL | Status: DC
  Administered 2024-05-28 – 2024-05-31 (×9): 20 g via ORAL
  Filled 2024-05-28 (×8): qty 30

## 2024-05-28 MED ORDER — ADULT MULTIVITAMIN W/MINERALS CH
1.0000 | ORAL_TABLET | Freq: Every day | ORAL | Status: DC
  Administered 2024-05-28 – 2024-05-31 (×4): 1 via ORAL
  Filled 2024-05-28 (×4): qty 1

## 2024-05-28 NOTE — ED Notes (Signed)
 Patient continues to beg us  to hurt her.

## 2024-05-28 NOTE — H&P (Signed)
 " History and Physical    Patient: Lynn Andrade FMW:[id number] DOB: July 02, 1970 DOA: 05/28/2024 DOS: the patient was seen and examined on 05/28/2024 PCP: Samie Frederick, PA-C  Patient coming from: Home  Chief Complaint:  Chief Complaint  Patient presents with   Alcohol Intoxication   HPI: Lynn Andrade is a 54 y.o. female with medical history significant for  morbid obesity, alcohol abuse, GERD, HTN, DM2, liver cirrhosis and depressive disorder presents to the ED with confusion and disorientation--per EDP EMS reported the patient has been drinking alcohol for the last couple days--mostly vodka - No fevers reported no vomiting or diarrhea reported - No significant cough noted - At this time patient is restless, confused and disoriented and unable to provide additional history - In the ED UDS is negative, UA not suggestive of UTI - Serum ammonia is 199, it was 100 on 05/19/2024 - Lactic acid is down to 1.3 from 2.0 - Blood alcohol level is negative - Serum acetaminophen  level negative - LFTs are not elevated except for T. bili of 2.5 which is similar to prior WBC 7.3 hemoglobin 9.7 which is similar to prior platelets 136 which is similar to prior  Review of Systems: As mentioned in the history of present illness. All other systems reviewed and are negative. Past Medical History:  Diagnosis Date   Acid reflux    Adjustment disorder with depressed mood 10/27/2009   Formatting of this note might be different from the original. Overview:  Adjustment Disorder With Depressed Mood   Chronic congestive heart failure (HCC)    Diabetes mellitus with coincident hypertension (HCC) 01/13/2022   Diabetes mellitus without complication (HCC)    Edema    Essential hypertension 08/31/2010   Formatting of this note might be different from the original. Hypertension Formatting of this note might be different from the original. Overview:  Hypertension   Fluid retention    Hypertension    Past Surgical  History:  Procedure Laterality Date   BILIOPANCREATIC DIVISION W DUODENAL SWITCH     CHOLECYSTECTOMY     DILATION AND CURETTAGE OF UTERUS     TONSILLECTOMY     Social History:  reports that she has been smoking cigarettes. She has never used smokeless tobacco. She reports current alcohol use. She reports that she does not use drugs.  Allergies[1]  Family History  Problem Relation Age of Onset   Diabetes Mother    Hypertension Mother    Bladder Cancer Mother    Breast cancer Mother    Kidney cancer Mother    Diabetes Father    Colon cancer Neg Hx    Esophageal cancer Neg Hx     Prior to Admission medications  Medication Sig Start Date End Date Taking? Authorizing Provider  ALBUTEROL  SULFATE HFA IN Inhale 2 Inhalations into the lungs as needed (for allergies).    [provider]  Cholecalciferol  1.25 MG (50000 UT) capsule Take 50,000 Units by mouth once a week. 12/28/23   [provider]  dapagliflozin  propanediol (FARXIGA ) 10 MG TABS tablet TAKE 1 TABLET BY MOUTH ONCE DAILY BEFORE BREAKFAST 05/17/22   Chandrasekhar, Mahesh A, MD  FEROSUL 325 (65 Fe) MG tablet Take 325 mg by mouth 2 (two) times daily. 02/28/21   [provider]  furosemide  (LASIX ) 40 MG tablet Take 40 mg by mouth 2 (two) times daily. 02/08/24   [provider]  lactulose  (CHRONULAC ) 10 GM/15ML solution Take 15 mLs (10 g total) by mouth 3 (three) times daily. 05/19/24  Elgergawy, Brayton RAMAN, MD  levothyroxine  (SYNTHROID ) 25 MCG tablet Take 25 mcg by mouth daily.    [provider]  metolazone  (ZAROXOLYN ) 5 MG tablet Take 5 mg by mouth daily as needed (fluid, swelling). 03/22/23   [provider]  nystatin  (MYCOSTATIN /NYSTOP ) powder Apply topically 3 (three) times daily. 01/21/24   Shahmehdi, Adriana LABOR, MD  sertraline  (ZOLOFT ) 50 MG tablet Take 1 tablet by mouth daily. 01/13/23   [provider]  traZODone  (DESYREL ) 50 MG tablet Take 50 mg by mouth at bedtime.  02/08/24   [provider]  triamcinolone  (NASACORT  ALLERGY 24HR) 55 MCG/ACT AERO nasal inhaler Place 2 sprays into the nose daily.    [provider]    Physical Exam: Vitals:   05/28/24 1615 05/28/24 1630 05/28/24 1645 05/28/24 1738  BP: 111/72 114/69 (!) 126/55 119/66  Pulse: 93 99 99 98  Resp: 20 18 (!) 22 20  Temp:    98 F (36.7 C)  TempSrc:    Axillary  SpO2: 97% 99% 97% 100%  Weight:    (!) 153.9 kg  Height:    5' 5 (1.651 m)    Physical Exam  Gen:-Morbidly obese, confused or disoriented HEENT:- [place].AT, +ve sclera icterus Neck-Supple Neck,No JVD,.  Lungs-  CTAB , fair air movement bilaterally  CV- S1, S2 normal, RRR Abd-  +ve B.Sounds, Abd Soft, No tenderness, increased truncal adiposity Extremity/Skin:- No  edema,   good pedal pulses  Psych-affect is appropriate, oriented x3 Neuro-no new focal deficits, no tremors  Data Reviewed: - In the ED UDS is negative, UA not suggestive of UTI - Serum ammonia is 199, it was 100 on 05/19/2024 - Lactic acid is down to 1.3 from 2.0 - Blood alcohol level is negative - Serum acetaminophen  level negative - LFTs are not elevated except for T. bili of 2.5 which is similar to prior WBC 7.3 hemoglobin 9.7 which is similar to prior platelets 136 which is similar to prior  Assessment and Plan: 1) acute hepatic encephalopathy with significant confusion and disorientation -- Serum ammonia is 199, it was 100 on 05/19/2024 --Lactulose  enemas for now when more awake and more appropriate may transition to oral lactulose  - No evidence of acute  2)DM2--A1c previously 4.2--reflecting excellent diabetic control PTA with risk for hypoglycemia -Hold Farxiga - Use Novolog /Humalog Sliding scale insulin  with Accu-Cheks/Fingersticks as ordered   3) chronic anemia---hemoglobin 9.7 which is similar to prior  prior workup suggest some component of iron deficiency continue iron replacement  4) chronic thrombocytopenia--platelets 136  which is similar to prior Related to underlying liver cirrhosis compounded by direct toxic effect of alcohol on the bone marrow - No bleeding concerns at this time  5) hypothyroidism--continue levothyroxine   6)Alcohol,Abuse--- apparently she drinks a lot of vodka -- High risk for DTs - Lorazepam  per CIWA protocol, -- folic acid  and thiamine  and multivitamin as ordered  7)GERD--- continue Protonix   8)Liver Cirrhosis--hold Lasix  and metolazone , avoid hepatotoxic agent   Advance Care Planning:   Code Status: Full Code   Severity of Illness: The appropriate patient status for this patient is INPATIENT. Inpatient status is judged to be reasonable and necessary in order to provide the required intensity of service to ensure the patient's safety. The patient's presenting symptoms, physical exam findings, and initial radiographic and laboratory data in the context of their chronic comorbidities is felt to place them at high risk for further clinical deterioration. Furthermore, it is not anticipated that the patient will be medically stable for  discharge from the hospital within 2 midnights of admission.   * I certify that at the point of admission it is my clinical judgment that the patient will require inpatient hospital care spanning beyond 2 midnights from the point of admission due to high intensity of service, high risk for further deterioration and high frequency of surveillance required.*  Author: Rendall Carwin, MD 05/28/2024 7:16 PM  For on call review [email or web address].     [1]  Allergies Allergen Reactions   Aspirin-Caffeine Hives and Swelling   Tape Dermatitis    Paper tape is okay   Toradol [Ketorolac Tromethamine] Other (See Comments)    Unknown    Etodolac Hives   Shrimp (Diagnostic) Hives   "

## 2024-05-28 NOTE — ED Notes (Addendum)
 Patient transported to [id number].

## 2024-05-28 NOTE — ED Notes (Signed)
 Blood labs drawn:  gold, blue, green x2, purple x2.

## 2024-05-28 NOTE — ED Provider Notes (Signed)
 "  EMERGENCY DEPARTMENT AT [facility] Provider Note   CSN: [id number] Arrival date & time: 05/28/24  0940     Patient presents with: Alcohol Intoxication   Lynn Andrade is a 54 y.o. female.    Alcohol Intoxication  Patient brought in by EMS.  Reportedly for mental status change.  Patient cannot really provide any history.  Reportedly drank half a gallon of vodka over the last 2 days.  Does have history of elevated ammonia and confusion.  Potential hepatic encephalopathy.  Also history of CHF.    Past Medical History:  Diagnosis Date   Acid reflux    Adjustment disorder with depressed mood 10/27/2009   Formatting of this note might be different from the original. Overview:  Adjustment Disorder With Depressed Mood   Chronic congestive heart failure (HCC)    Diabetes mellitus with coincident hypertension (HCC) 01/13/2022   Diabetes mellitus without complication (HCC)    Edema    Essential hypertension 08/31/2010   Formatting of this note might be different from the original. Hypertension Formatting of this note might be different from the original. Overview:  Hypertension   Fluid retention    Hypertension     Prior to Admission medications  Medication Sig Start Date End Date Taking? Authorizing Provider  ALBUTEROL  SULFATE HFA IN Inhale 2 Inhalations into the lungs as needed (for allergies).    [provider]  Cholecalciferol  1.25 MG (50000 UT) capsule Take 50,000 Units by mouth once a week. 12/28/23   [provider]  dapagliflozin  propanediol (FARXIGA ) 10 MG TABS tablet TAKE 1 TABLET BY MOUTH ONCE DAILY BEFORE BREAKFAST 05/17/22   Chandrasekhar, Mahesh A, MD  FEROSUL 325 (65 Fe) MG tablet Take 325 mg by mouth 2 (two) times daily. 02/28/21   [provider]  furosemide  (LASIX ) 40 MG tablet Take 40 mg by mouth 2 (two) times daily. 02/08/24   [provider]  lactulose  (CHRONULAC ) 10 GM/15ML solution Take 15 mLs (10 g total)  by mouth 3 (three) times daily. 05/19/24   Elgergawy, Brayton RAMAN, MD  levothyroxine  (SYNTHROID ) 25 MCG tablet Take 25 mcg by mouth daily.    [provider]  metolazone  (ZAROXOLYN ) 5 MG tablet Take 5 mg by mouth daily as needed (fluid, swelling). 03/22/23   [provider]  nystatin  (MYCOSTATIN /NYSTOP ) powder Apply topically 3 (three) times daily. 01/21/24   Shahmehdi, Adriana LABOR, MD  sertraline  (ZOLOFT ) 50 MG tablet Take 1 tablet by mouth daily. 01/13/23   [provider]  traZODone  (DESYREL ) 50 MG tablet Take 50 mg by mouth at bedtime. 02/08/24   [provider]  triamcinolone  (NASACORT  ALLERGY 24HR) 55 MCG/ACT AERO nasal inhaler Place 2 sprays into the nose daily.    [provider]    Allergies: Aspirin-caffeine, Tape, Toradol [ketorolac tromethamine], Etodolac, and Shrimp (diagnostic)    Review of Systems  Updated Vital Signs BP 133/89   Pulse (!) 105   Temp (!) 97.4 F (36.3 C) (Rectal)   Resp (!) 24   Wt 129.7 kg   SpO2 98%   BMI 46.15 kg/m   Physical Exam Vitals and nursing note reviewed.  HENT:     Head: Normocephalic.  Eyes:     Pupils: Pupils are equal, round, and reactive to light.  Cardiovascular:     Rate and Rhythm: Tachycardia present.  Pulmonary:     Breath sounds: No wheezing.  Abdominal:     Tenderness: There is no abdominal tenderness.  Musculoskeletal:  Right lower leg: Edema present.     Left lower leg: Edema present.  Skin:    Coloration: Skin is pale. Skin is not jaundiced.  Neurological:     Comments: Awake.  Confused.  Sitting with eyes closed.  Will not follow commands.  Does not appear to be moving all extremities.     (all labs ordered are listed, but only abnormal results are displayed) Labs Reviewed  CBC WITH DIFFERENTIAL/PLATELET - Abnormal; Notable for the following components:      Result Value   RBC 3.13 (*)    Hemoglobin 9.7 (*)    HCT 29.5 (*)    RDW 17.7 (*)    Platelets 136 (*)     Lymphs Abs 0.5 (*)    All other components within normal limits  COMPREHENSIVE METABOLIC PANEL WITH GFR - Abnormal; Notable for the following components:   Glucose, Bld 106 (*)    Calcium 8.7 (*)    Albumin  3.2 (*)    Total Bilirubin 2.5 (*)    All other components within normal limits  ACETAMINOPHEN  LEVEL - Abnormal; Notable for the following components:   Acetaminophen  (Tylenol ), Serum <10 (*)    All other components within normal limits  PROTIME-INR - Abnormal; Notable for the following components:   Prothrombin Time 15.6 (*)    All other components within normal limits  AMMONIA - Abnormal; Notable for the following components:   Ammonia 199 (*)    All other components within normal limits  PRO BRAIN NATRIURETIC PEPTIDE - Abnormal; Notable for the following components:   Pro Brain Natriuretic Peptide 687.0 (*)    All other components within normal limits  LACTIC ACID, PLASMA - Abnormal; Notable for the following components:   Lactic Acid, Venous 2.0 (*)    All other components within normal limits  URINALYSIS, W/ REFLEX TO CULTURE (INFECTION SUSPECTED) - Abnormal; Notable for the following components:   Color, Urine AMBER (*)    All other components within normal limits  CULTURE, BLOOD (ROUTINE X 2)  CULTURE, BLOOD (ROUTINE X 2)  ETHANOL  LACTIC ACID, PLASMA  URINE DRUG SCREEN    EKG: None  Radiology: CT Head Wo Contrast Result Date: 05/28/2024 EXAM: CT HEAD WITHOUT CONTRAST 05/28/2024 10:35:48 AM TECHNIQUE: CT of the head was performed without the administration of intravenous contrast. Automated exposure control, iterative reconstruction, and/or weight based adjustment of the mA/kV was utilized to reduce the radiation dose to as low as reasonably achievable. COMPARISON: Head CT 01/25/2022. CLINICAL HISTORY: 54 year old female with excessive drinking and altered mental status. FINDINGS: BRAIN AND VENTRICLES: No acute hemorrhage. No evidence of acute infarct. No  hydrocephalus. No extra-axial collection. No mass effect or midline shift. Brain volume remains normal. Chronic partially empty sella, nonspecific and significance doubtful. There is patchy very mild for age bilateral cerebral white matter hypodensity, not significantly changed. No suspicious intracranial vascular hyperdensity. ORBITS: No gaze deviation. SINUSES: Paranasal sinuses, tympanic cavities and mastoids are clear. SOFT TISSUES AND SKULL: No acute soft tissue abnormality. No skull fracture. IMPRESSION: 1. Stable since 2023, largely negative for age non-contrast CT appearance of the brain. Electronically signed by: Helayne Hurst MD 05/28/2024 10:41 AM EST RP Workstation: [id number]   DG Chest Portable 1 View Result Date: 05/28/2024 CLINICAL DATA:  Altered mental status.  Weakness. EXAM: PORTABLE CHEST 1 VIEW COMPARISON:  Radiographs 02/09/2024 and 01/18/2024 FINDINGS: 1009 hours. Lower lung volumes. The heart size and mediastinal contours are stable with mild cardiac enlargement. Increased vascular congestion without  confluent airspace disease, overt pulmonary edema, pneumothorax or significant pleural effusion. The bones appear unremarkable. Telemetry leads overlie the chest. IMPRESSION: Lower lung volumes with increased vascular congestion. No overt pulmonary edema or focal airspace disease. Electronically Signed   By: Elsie Perone M.D.   On: 05/28/2024 10:36     Procedures   Medications Ordered in the ED  sodium chloride  0.9 % bolus 1,000 mL (1,000 mLs Intravenous New Bag/Given 05/28/24 1121)    Clinical Course as of 05/28/24 1324  Mon May 28, 2024  1027 Patient with some hypothermia.  Will now add blood cultures and lactate although not clearly infectious at this point. [NP]    Clinical Course User Index [NP] Patsey Lot, MD                                 Medical Decision Making Amount and/or Complexity of Data Reviewed Labs: ordered. Radiology: ordered.  Risk Decision  regarding hospitalization.   Patient brought in for mental status change.  Reportedly large amount of alcohol drink.  Alcohol intoxication could do this also hepatic encephalopathy.  Also infection considered.  Will get rectal temperature.  Will get ammonia basic blood work including alcohol.  Will get chest x-ray.  Will also get head CT.  Reviewed previous GI note.  Head CT reassuring.  CBC shows an anemia that appears to be near baseline along with mild thrombocytopenia.  CMP shows mildly elevated bilirubin and albumin  at 3.2.  Normal alcohol level.  However ammonia is 199.  With mental status change I think this is most likely the cause.  Head CT reassuring.  Will discuss with hospitalist for admission.       Final diagnoses:  Hepatic encephalopathy [facility])    ED Discharge Orders     None          Patsey Lot, MD 05/28/24 1324  "

## 2024-05-28 NOTE — Progress Notes (Signed)
" °   05/28/24 1344  TOC Brief Assessment  Insurance and Status Reviewed  Patient has primary care physician Yes  Home environment has been reviewed From Home  Prior level of function: Independent  Prior/Current Home Services No current home services  Social Drivers of Health Review SDOH reviewed no interventions necessary  Readmission risk has been reviewed Yes  Transition of care needs no transition of care needs at this time    Inpatient Care Management (ICM) has reviewed patient and no other ICM needs have been identified at this time. We will continue to monitor patient advancement through interdisciplinary progression rounds. If new patient transition needs arise, please place a ICM consult.  "

## 2024-05-28 NOTE — ED Notes (Signed)
Tele monitoring started.

## 2024-05-28 NOTE — ED Triage Notes (Signed)
 Pt room mate told EMS pt drank a half gallon of alcohol over 2 days and was seen in this ER for Liver problems 1 to 2 weeks ago.

## 2024-05-28 NOTE — Plan of Care (Signed)

## 2024-05-29 LAB — COMPREHENSIVE METABOLIC PANEL WITH GFR
ALT: 19 U/L (ref 0–44)
AST: 56 U/L — ABNORMAL HIGH (ref 15–41)
Albumin: 2.5 g/dL — ABNORMAL LOW (ref 3.5–5.0)
Alkaline Phosphatase: 71 U/L (ref 38–126)
Anion gap: 8 (ref 5–15)
BUN: 15 mg/dL (ref 6–20)
CO2: 25 mmol/L (ref 22–32)
Calcium: 8.1 mg/dL — ABNORMAL LOW (ref 8.9–10.3)
Chloride: 111 mmol/L (ref 98–111)
Creatinine, Ser: 0.82 mg/dL (ref 0.44–1.00)
GFR, Estimated: >60 mL/min
Glucose, Bld: 66 mg/dL — ABNORMAL LOW (ref 70–99)
Potassium: 3 mmol/L — ABNORMAL LOW (ref 3.5–5.1)
Sodium: 144 mmol/L (ref 135–145)
Total Bilirubin: 1.8 mg/dL — ABNORMAL HIGH (ref 0.0–1.2)
Total Protein: 5.3 g/dL — ABNORMAL LOW (ref 6.5–8.1)

## 2024-05-29 LAB — PROTIME-INR
INR: 1.3 — ABNORMAL HIGH (ref 0.8–1.2)
Prothrombin Time: 17.4 s — ABNORMAL HIGH (ref 11.4–15.2)

## 2024-05-29 LAB — CBC
HCT: 22.6 % — ABNORMAL LOW (ref 36.0–46.0)
Hemoglobin: 7.3 g/dL — ABNORMAL LOW (ref 12.0–15.0)
MCH: 31.1 pg (ref 26.0–34.0)
MCHC: 32.3 g/dL (ref 30.0–36.0)
MCV: 96.2 fL (ref 80.0–100.0)
Platelets: 104 10*3/uL — ABNORMAL LOW (ref 150–400)
RBC: 2.35 MIL/uL — ABNORMAL LOW (ref 3.87–5.11)
RDW: 17.6 % — ABNORMAL HIGH (ref 11.5–15.5)
WBC: 3.6 10*3/uL — ABNORMAL LOW (ref 4.0–10.5)
nRBC: 0 % (ref 0.0–0.2)

## 2024-05-29 LAB — GLUCOSE, CAPILLARY
Glucose-Capillary: 79 mg/dL (ref 70–99)
Glucose-Capillary: 80 mg/dL (ref 70–99)
Glucose-Capillary: 86 mg/dL (ref 70–99)
Glucose-Capillary: 112 mg/dL — ABNORMAL HIGH (ref 70–99)

## 2024-05-29 LAB — MAGNESIUM: Magnesium: 2 mg/dL (ref 1.7–2.4)

## 2024-05-29 MED ORDER — SPIRONOLACTONE 25 MG PO TABS
25.0000 mg | ORAL_TABLET | Freq: Every day | ORAL | Status: DC
  Administered 2024-05-29 – 2024-05-31 (×3): 25 mg via ORAL
  Filled 2024-05-29 (×3): qty 1

## 2024-05-29 MED ORDER — FUROSEMIDE 40 MG PO TABS
40.0000 mg | ORAL_TABLET | Freq: Every day | ORAL | Status: DC
  Administered 2024-05-29 – 2024-05-31 (×3): 40 mg via ORAL
  Filled 2024-05-29 (×3): qty 1

## 2024-05-29 MED ORDER — POTASSIUM CHLORIDE CRYS ER 20 MEQ PO TBCR
40.0000 meq | EXTENDED_RELEASE_TABLET | ORAL | Status: AC
  Administered 2024-05-29 (×2): 40 meq via ORAL
  Filled 2024-05-29 (×2): qty 2

## 2024-05-29 NOTE — TOC Initial Note (Signed)
 Transition of Care [facility]) - Initial/Assessment Note    Patient Details  Name: Lynn Andrade MRN: [id number] Date of Birth: [birth year]  Transition of Care [facility]) CM/SW Contact:    Mcarthur Saddie Kim, LCSW Phone Number: 05/29/2024, 2:22 PM  Clinical Narrative:  Pt admitted for acute hepatic encephalopathy. Assessment completed due to high risk readmission score. Pt reports she lives with a roommate and is fairly independent with ADLs at baseline. She has transportation to appointments. Pt indicates she has applied for Medicaid. TOC received consult for substance abuse counseling. Pt feels this is ridiculous and indicates she does not drink regularly and states this is not a problem for her.   PT/OT evaluated pt and recommend home health. LCSW discussed with pt who declines. She is aware to notify TOC if she changes her mind, but understands that it may not be possible to arrange as pt currently has no insurance.                 Expected Discharge Plan: Home/Self Care Barriers to Discharge: Continued Medical Work up   Patient Goals and CMS Choice Patient states their goals for this hospitalization and ongoing recovery are:: return home   Choice offered to / list presented to : Patient [facility] ownership interest in [facility].provided to::  (n/a)    Expected Discharge Plan and Services In-house Referral: Clinical Social Work     Living arrangements for the past 2 months: Single Family Home                           HH Arranged: Refused HH          Prior Living Arrangements/Services Living arrangements for the past 2 months: Single Family Home Lives with:: Roommate Patient language and need for interpreter reviewed:: Yes Do you feel safe going back to the place where you live?: Yes      Need for Family Participation in Patient Care: No (Comment)     Criminal Activity/Legal Involvement Pertinent to Current Situation/Hospitalization: No - Comment as  needed  Activities of Daily Living   ADL Screening (condition at time of admission) Independently performs ADLs?: No Does the patient have a NEW difficulty with bathing/dressing/toileting/self-feeding that is expected to last >3 days?: Yes (Initiates electronic notice to provider for possible OT consult) Does the patient have a NEW difficulty with getting in/out of bed, walking, or climbing stairs that is expected to last >3 days?: Yes (Initiates electronic notice to provider for possible PT consult) Does the patient have a NEW difficulty with communication that is expected to last >3 days?: No Is the patient deaf or have difficulty hearing?: No Does the patient have difficulty seeing, even when wearing glasses/contacts?: No Does the patient have difficulty concentrating, remembering, or making decisions?: Yes  Permission Sought/Granted                  Emotional Assessment     Affect (typically observed): Appropriate Orientation: : Oriented to Self, Oriented to Place, Oriented to  Time, Oriented to Situation   Psych Involvement: No (comment)  Admission diagnosis:  Hepatic encephalopathy (HCC) [K76.82] Acute hepatic encephalopathy (HCC) [K76.82] Patient Active Problem List   Diagnosis Date Noted   Acute hepatic encephalopathy (HCC) 05/28/2024   Hyperammonemia 05/19/2024   Vomiting and diarrhea 01/18/2024   Acute on chronic diastolic CHF (congestive heart failure) (HCC) 01/18/2024   DM (diabetes mellitus) (HCC) 01/18/2024   Pain in left shoulder  03/03/2023   Liver fibrosis 03/02/2022   Hepatic steatosis 03/02/2022   Screening for colon cancer 03/02/2022   Chronic heart failure with preserved ejection fraction (HCC) 02/25/2022   Hyperammonemic encephalopathy 01/25/2022   Heart failure, type unknown (HCC) 01/13/2022   Newly recognized heart murmur 01/13/2022   Morbid obesity (HCC) 01/13/2022   History of bariatric surgery 04/21/2016   Obstructive sleep apnea (adult)  (pediatric) 02/19/2014   HTN (hypertension) 08/31/2010   Hyperlipidemia LDL goal <100 10/27/2009   Gastro-esophageal reflux disease without esophagitis 01/02/2009   PCP:  Samie Frederick, PA-C Pharmacy:   [facility] [address] [place] - [address] [place] [address] Phone: [phone] Fax: [phone]     Social Drivers of Health (SDOH) Social History: SDOH Screenings   Food Insecurity: Patient Unable To Answer (05/28/2024)  Housing: Unknown (05/28/2024)  Transportation Needs: Patient Unable To Answer (05/28/2024)  Utilities: Patient Unable To Answer (05/28/2024)  Financial Resource Strain: Low Risk (12/27/2023)   Received from [facility]  Tobacco Use: High Risk (05/28/2024)   SDOH Interventions:     Readmission Risk Interventions    05/29/2024    2:21 PM 05/28/2024    1:44 PM  Readmission Risk Prevention Plan  Transportation Screening Complete Complete  Home Care Screening  Complete  Medication Review (RN CM)  Complete  HRI or Home Care Consult Complete   Social Work Consult for Recovery Care Planning/Counseling Complete   Palliative Care Screening Not Applicable   Medication Review Oceanographer) Complete

## 2024-05-29 NOTE — Evaluation (Signed)
 Physical Therapy Evaluation Patient Details Name: Lynn Andrade MRN: [id number] DOB: Mar 01, 1971 Today's Date: 05/29/2024  History of Present Illness  Lynn Andrade is a 54 y.o. female with medical history significant for  morbid obesity, alcohol abuse, GERD, HTN, DM2, liver cirrhosis and depressive disorder presents to the ED with confusion and disorientation--per EDP EMS reported the patient has been drinking alcohol for the last couple days--mostly vodka  - No fevers reported no vomiting or diarrhea reported  - No significant cough noted  - At this time patient is restless, confused and disoriented and unable to provide additional history   Clinical Impression  Patient has mild difficulty sitting up at bedside with HOB flat due to weakness, able to stand ambulate without AD, but limited for gait training mostly due to c/o lightheadedness and followed with w/c for safety. Patient demonstrates good return for transferring to/from commode in bathroom without assistance. Patient tolerated sitting up in chair after therapy. Patient will benefit from continued skilled physical therapy in hospital and recommended venue below to increase strength, balance, endurance for safe ADLs and gait.          If plan is discharge home, recommend the following: A little help with walking and/or transfers;A little help with bathing/dressing/bathroom;Help with stairs or ramp for entrance;Assist for transportation;Assistance with cooking/housework   Can travel by private vehicle        Equipment Recommendations None recommended by PT  Recommendations for Other Services       Functional Status Assessment Patient has had a recent decline in their functional status and demonstrates the ability to make significant improvements in function in a reasonable and predictable amount of time.     Precautions / Restrictions Precautions Precautions: Fall Recall of Precautions/Restrictions: Intact Restrictions Weight  Bearing Restrictions Per Provider Order: No      Mobility  Bed Mobility Overal bed mobility: Needs Assistance Bed Mobility: Supine to Sit     Supine to sit: Min assist     General bed mobility comments: increased time, labored movement with HOB flat    Transfers Overall transfer level: Needs assistance   Transfers: Sit to/from Stand, Bed to chair/wheelchair/BSC Sit to Stand: Contact guard assist, Supervision   Step pivot transfers: Supervision, Contact guard assist       General transfer comment: slightly unsteady without loss of balance or need for an AD    Ambulation/Gait Ambulation/Gait assistance: Contact guard assist Gait Distance (Feet): 85 Feet Assistive device: None Gait Pattern/deviations: Decreased step length - right, Decreased step length - left, Decreased stride length Gait velocity: decr     General Gait Details: slightly labored movement with decreased stride and step length, limited mostly due to feeling lightheaded and had to return to room in wheelchair  Stairs            Wheelchair Mobility     Tilt Bed    Modified Rankin (Stroke Patients Only)       Balance Overall balance assessment: Needs assistance Sitting-balance support: No upper extremity supported, Feet supported Sitting balance-Leahy Scale: Fair Sitting balance - Comments: fair to good seated at EOB.   Standing balance support: No upper extremity supported, During functional activity Standing balance-Leahy Scale: Fair Standing balance comment: without AD                             Pertinent Vitals/Pain Pain Assessment Pain Assessment: 0-10 Pain Score: 6  Pain Location: B hips Pain  Descriptors / Indicators: Aching, Dull Pain Intervention(s): Limited activity within patient's tolerance, Monitored during session, Repositioned    Home Living Family/patient expects to be discharged to:: Private residence Living Arrangements: Children Available Help at  Discharge: Family;Available PRN/intermittently Type of Home: House Home Access: Stairs to enter Entrance Stairs-Rails: Right;Left;Can reach both Entrance Stairs-Number of Steps: 5 front; 1 + 1 in back   Home Layout: One level Home Equipment: None      Prior Function Prior Level of Function : Driving;History of Falls (last six months);Needs assist       Physical Assist : ADLs (physical);Mobility (physical) Mobility (physical): Bed mobility;Transfers;Gait;Stairs ADLs (physical): Dressing Mobility Comments: Community ambulator without AD; works; assisted to clear channel communications by son. ADLs Comments: Independent     Extremity/Trunk Assessment   Upper Extremity Assessment Upper Extremity Assessment: Defer to OT evaluation LUE Deficits / Details: 3+/5 shoulder flexion and abduction which the pt reports is new for her. Pt states that this may have happended as a result of her fall.    Lower Extremity Assessment Lower Extremity Assessment: Generalized weakness    Cervical / Trunk Assessment Cervical / Trunk Assessment: Normal  Communication   Communication Communication: No apparent difficulties    Cognition Arousal: Alert Behavior During Therapy: WFL for tasks assessed/performed                             Following commands: Intact       Cueing Cueing Techniques: Verbal cues, Tactile cues     General Comments      Exercises     Assessment/Plan    PT Assessment Patient needs continued PT services  PT Problem List Decreased strength;Decreased activity tolerance;Decreased balance;Decreased mobility       PT Treatment Interventions DME instruction;Gait training;Stair training;Functional mobility training;Therapeutic activities;Therapeutic exercise;Balance training;Patient/family education    PT Goals (Current goals can be found in the Care Plan section)  Acute Rehab PT Goals Patient Stated Goal: return home with family to assist PT Goal Formulation: With  patient Time For Goal Achievement: 06/01/24 Potential to Achieve Goals: Good    Frequency Min 2X/week     Co-evaluation PT/OT/SLP Co-Evaluation/Treatment: Yes Reason for Co-Treatment: To address functional/ADL transfers PT goals addressed during session: Mobility/safety with mobility;Balance OT goals addressed during session: ADL's and self-care       AM-PAC PT 6 Clicks Mobility  Outcome Measure Help needed turning from your back to your side while in a flat bed without using bedrails?: A Little Help needed moving from lying on your back to sitting on the side of a flat bed without using bedrails?: A Little Help needed moving to and from a bed to a chair (including a wheelchair)?: A Little Help needed standing up from a chair using your arms (e.g., wheelchair or bedside chair)?: A Little Help needed to walk in hospital room?: A Little Help needed climbing 3-5 steps with a railing? : A Lot 6 Click Score: 17    End of Session   Activity Tolerance: Patient tolerated treatment well;Patient limited by fatigue Patient left: in chair;with call bell/phone within reach Nurse Communication: Mobility status PT Visit Diagnosis: Unsteadiness on feet (R26.81);Other abnormalities of gait and mobility (R26.89);Muscle weakness (generalized) (M62.81)    Time: 9043-8978 PT Time Calculation (min) (ACUTE ONLY): 25 min   Charges:   PT Evaluation $PT Eval Moderate Complexity: 1 Mod PT Treatments $Therapeutic Activity: 23-37 mins PT General Charges $$ ACUTE PT VISIT: 1 Visit  2:43 PM, 05/29/24 Lynwood Music, MPT Physical Therapist with [facility] [phone] office [phone] mobile phone

## 2024-05-29 NOTE — Plan of Care (Signed)
" °  Problem: Safety: Goal: Non-violent Restraint(s) Outcome: Progressing   Problem: Education: Goal: Knowledge of General Education information will improve Description: Including pain rating scale, medication(s)/side effects and non-pharmacologic comfort measures Outcome: Progressing   Problem: Clinical Measurements: Goal: Ability to maintain clinical measurements within normal limits will improve Outcome: Progressing   Problem: Activity: Goal: Risk for activity intolerance will decrease Outcome: Progressing   Problem: Elimination: Goal: Will not experience complications related to bowel motility Outcome: Progressing   Problem: Pain Managment: Goal: General experience of comfort will improve and/or be controlled Outcome: Progressing   Problem: Safety: Goal: Ability to remain free from injury will improve Outcome: Progressing   Problem: Skin Integrity: Goal: Risk for impaired skin integrity will decrease Outcome: Progressing   Problem: Education: Goal: Ability to describe self-care measures that may prevent or decrease complications (Diabetes Survival Skills Education) will improve Outcome: Progressing   Problem: Skin Integrity: Goal: Risk for impaired skin integrity will decrease Outcome: Progressing   "

## 2024-05-29 NOTE — Plan of Care (Signed)
" °  Problem: Acute Rehab PT Goals(only PT should resolve) Goal: Pt Will Go Supine/Side To Sit Outcome: Progressing Flowsheets (Taken 05/29/2024 1444) Pt will go Supine/Side to Sit: with modified independence Goal: Patient Will Transfer Sit To/From Stand Outcome: Progressing Flowsheets (Taken 05/29/2024 1444) Patient will transfer sit to/from stand: Independently Goal: Pt Will Transfer Bed To Chair/Chair To Bed Outcome: Progressing Flowsheets (Taken 05/29/2024 1444) Pt will Transfer Bed to Chair/Chair to Bed: Independently Goal: Pt Will Ambulate Outcome: Progressing Flowsheets (Taken 05/29/2024 1444) Pt will Ambulate:  > 125 feet  with modified independence  with least restrictive assistive device   2:45 PM, 05/29/24 Lynwood Music, MPT Physical Therapist with [facility] [phone] office [phone] mobile phone  "

## 2024-05-29 NOTE — Progress Notes (Signed)
 " PROGRESS NOTE  Lynn Andrade, is a 54 y.o. female, DOB - 03-30-1971, FMW:[id number]  Admit date - 05/28/2024   Admitting Physician Malayiah Mcbrayer Pearlean, MD  Outpatient Primary MD for the patient is Samie Frederick, PA-C  LOS - 1  Chief Complaint  Patient presents with   Alcohol Intoxication      Brief Narrative:  54 y.o. female with medical history significant for morbid obesity, alcohol abuse, GERD, HTN, DM2, liver cirrhosis and depressive disorder admitted on 05/28/2024 with acute hepatic encephalopathy with serum ammonia of 199 in setting of ongoing alcohol abuse    -Assessment and Plan: 1)Acute Hepatic Encephalopathy with significant confusion and disorientation -- Serum ammonia is 199, it was 100 on 05/19/2024 --Lactulose  enemas for now when more awake and more appropriate may transition to oral lactulose  - No evidence of acute   2)Liver Cirrhosis--??  Liver cirrhosis possibly due to a combination of fatty liver and alcohol abuse  -- Okay to restart PTA Lasix  , add Aldactone   -- avoid hepatotoxic agents   3) chronic anemia---hemoglobin 9.7 which is similar to prior  prior workup suggest some component of iron deficiency continue iron replacement   4) chronic thrombocytopenia/pancytopenia--platelets  similar to prior Related to underlying liver cirrhosis compounded by direct toxic effect of alcohol on the bone marrow - No bleeding concerns at this time   5) hypothyroidism--continue levothyroxine    6)Alcohol,Abuse--- apparently she drinks a lot of vodka -- High risk for DTs - Lorazepam  per CIWA protocol, -- c/n folic acid  and thiamine  and multivitamin as ordered   7)GERD--- continue Protonix    8)DM2--A1c previously 4.2--reflecting excellent diabetic control PTA with risk for hypoglycemia -Hold Farxiga - Use Novolog /Humalog Sliding scale insulin  with Accu-Cheks/Fingersticks as ordered   9)Morbid Obesity- -Low calorie diet, portion control and increase physical activity discussed  with patient -Body mass index is 56.46 kg/m.  10) generalized weakness and deconditioning--PT eval appreciated recommends home health physical therapy  Status is: Inpatient   Disposition: The patient is from: Home              Anticipated d/c is to: Home              Anticipated d/c date is: 1 day              Patient currently is not medically stable to d/c. Barriers: Not Clinically Stable-   Code Status :  -  Code Status: Full Code   Family Communication:    NA (patient is alert, awake and coherent)   DVT Prophylaxis  :   - SCDs   SCDs Start: 05/28/24 1339 Place TED hose Start: 05/28/24 1339   Lab Results  Component Value Date   PLT 104 (L) 05/29/2024    Inpatient Medications  Scheduled Meds:  ferrous sulfate   325 mg Oral Q breakfast   folic acid   1 mg Oral Daily   furosemide   40 mg Oral Daily   insulin  aspart  0-5 Units Subcutaneous QHS   insulin  aspart  0-6 Units Subcutaneous TID WC   lactulose   20 g Oral TID   levothyroxine   25 mcg Oral Q0600   multivitamin with minerals  1 tablet Oral Daily   pantoprazole  (PROTONIX ) IV  40 mg Intravenous Q24H   thiamine   100 mg Oral Daily   Or   thiamine   100 mg Intravenous Daily   Continuous Infusions: PRN Meds:.acetaminophen  **OR** acetaminophen , albuterol , bisacodyl , LORazepam  **OR** LORazepam , ondansetron  **OR** ondansetron  (ZOFRAN ) IV, polyethylene glycol, sodium chloride  flush, traZODone   Anti-infectives (From admission, onward)    None       Subjective: Penne Gartner today has no fevers, no emesis,  No chest pain,   - Had multiple BMs -Patient is now more coherent, ambulating around the room - More appropriate - RN John at bedside    Objective: Vitals:   05/28/24 2146 05/29/24 0139 05/29/24 0537 05/29/24 1431  BP: (!) 100/50 122/84 (!) 127/54 (!) 122/56  Pulse: 77 86 91 78  Resp: 18 20 19 18   Temp: 98.5 F (36.9 C)  99.1 F (37.3 C) 98.4 F (36.9 C)  TempSrc: Axillary  Axillary Oral  SpO2: 98%  100% 100% 100%  Weight:      Height:        Intake/Output Summary (Last 24 hours) at 05/29/2024 1851 Last data filed at 05/29/2024 1300 Gross per 24 hour  Intake 1560 ml  Output 675 ml  Net 885 ml   Filed Weights   05/28/24 0947 05/28/24 1738  Weight: 129.7 kg (!) 153.9 kg    Physical Exam  Gen:- Awake Alert, morbidly obese, cooperative HEENT:- [place].AT, No sclera icterus Neck-Supple Neck,No JVD,.  Lungs-  CTAB , fair symmetrical air movement CV- S1, S2 normal, regular  Abd-  +ve B.Sounds, Abd Soft, No tenderness, increased truncal adiposity Extremity/Skin:- No  edema, pedal pulses present  Psych-affect is appropriate, oriented x3 Neuro-generalized weakness, no new focal deficits, no significant tremors  Data Reviewed: I have personally reviewed following labs and imaging studies  CBC: Recent Labs  Lab 05/28/24 0955 05/29/24 0418  WBC 7.3 3.6*  NEUTROABS 6.2  --   HGB 9.7* 7.3*  HCT 29.5* 22.6*  MCV 94.2 96.2  PLT 136* 104*   Basic Metabolic Panel: Recent Labs  Lab 05/28/24 0955 05/29/24 0418  NA 144 144  K 4.3 3.0*  CL 106 111  CO2 24 25  GLUCOSE 106* 66*  BUN 15 15  CREATININE 0.98 0.82  CALCIUM 8.7* 8.1*  MG  --  2.0   GFR: Estimated Creatinine Clearance: 120 mL/min (by C-G formula based on SCr of 0.82 mg/dL). Liver Function Tests: Recent Labs  Lab 05/28/24 0955 05/29/24 0418  AST 40 56*  ALT 20 19  ALKPHOS 89 71  BILITOT 2.5* 1.8*  PROT 6.9 5.3*  ALBUMIN  3.2* 2.5*   BNP (last 3 results) Recent Labs    02/09/24 1247 05/28/24 0955  PROBNP 935.0* 687.0*   Recent Results (from the past 240 hours)  Culture, blood (routine x 2)     Status: None (Preliminary result)   Collection Time: 05/28/24 11:13 AM   Specimen: BLOOD  Result Value Ref Range Status   Specimen Description BLOOD BLOOD RIGHT HAND  Final   Special Requests   Final    BOTTLES DRAWN AEROBIC AND ANAEROBIC Blood Culture adequate volume   Culture   Final    NO GROWTH < 24  HOURS Performed at [facility], [address], [place], [place] [address]    Report Status PENDING  Incomplete  Culture, blood (routine x 2)     Status: None (Preliminary result)   Collection Time: 05/28/24 11:13 AM   Specimen: BLOOD  Result Value Ref Range Status   Specimen Description BLOOD BLOOD RIGHT FOREARM  Final   Special Requests   Final    BOTTLES DRAWN AEROBIC AND ANAEROBIC Blood Culture adequate volume   Culture   Final    NO GROWTH < 24 HOURS Performed at [facility], [address] [place],  [place] [address]    Report Status PENDING  Incomplete     Radiology Studies: CT Head Wo Contrast Result Date: 05/28/2024 EXAM: CT HEAD WITHOUT CONTRAST 05/28/2024 10:35:48 AM TECHNIQUE: CT of the head was performed without the administration of intravenous contrast. Automated exposure control, iterative reconstruction, and/or weight based adjustment of the mA/kV was utilized to reduce the radiation dose to as low as reasonably achievable. COMPARISON: Head CT 01/25/2022. CLINICAL HISTORY: 54 year old female with excessive drinking and altered mental status. FINDINGS: BRAIN AND VENTRICLES: No acute hemorrhage. No evidence of acute infarct. No hydrocephalus. No extra-axial collection. No mass effect or midline shift. Brain volume remains normal. Chronic partially empty sella, nonspecific and significance doubtful. There is patchy very mild for age bilateral cerebral white matter hypodensity, not significantly changed. No suspicious intracranial vascular hyperdensity. ORBITS: No gaze deviation. SINUSES: Paranasal sinuses, tympanic cavities and mastoids are clear. SOFT TISSUES AND SKULL: No acute soft tissue abnormality. No skull fracture. IMPRESSION: 1. Stable since 2023, largely negative for age non-contrast CT appearance of the brain. Electronically signed by: Helayne Hurst MD 05/28/2024 10:41 AM EST RP Workstation: [id number]   DG Chest Portable 1 View Result Date: 05/28/2024 CLINICAL DATA:   Altered mental status.  Weakness. EXAM: PORTABLE CHEST 1 VIEW COMPARISON:  Radiographs 02/09/2024 and 01/18/2024 FINDINGS: 1009 hours. Lower lung volumes. The heart size and mediastinal contours are stable with mild cardiac enlargement. Increased vascular congestion without confluent airspace disease, overt pulmonary edema, pneumothorax or significant pleural effusion. The bones appear unremarkable. Telemetry leads overlie the chest. IMPRESSION: Lower lung volumes with increased vascular congestion. No overt pulmonary edema or focal airspace disease. Electronically Signed   By: Elsie Perone M.D.   On: 05/28/2024 10:36   Scheduled Meds:  ferrous sulfate   325 mg Oral Q breakfast   folic acid   1 mg Oral Daily   furosemide   40 mg Oral Daily   insulin  aspart  0-5 Units Subcutaneous QHS   insulin  aspart  0-6 Units Subcutaneous TID WC   lactulose   20 g Oral TID   levothyroxine   25 mcg Oral Q0600   multivitamin with minerals  1 tablet Oral Daily   pantoprazole  (PROTONIX ) IV  40 mg Intravenous Q24H   thiamine   100 mg Oral Daily   Or   thiamine   100 mg Intravenous Daily   Continuous Infusions:   LOS: 1 day   Rendall Carwin M.D on 05/29/2024 at 6:51 PM  Go to [email or web address] - for contact info  [facility] - Office  [phone]  If 7PM-7AM, please contact night-coverage [email or web address] 05/29/2024, 6:51 PM    "

## 2024-05-29 NOTE — Plan of Care (Signed)
" °  Problem: Acute Rehab OT Goals (only OT should resolve) Goal: Pt. Will Perform Grooming Flowsheets (Taken 05/29/2024 1344) Pt Will Perform Grooming:  with modified independence  standing Goal: Pt. Will Perform Lower Body Dressing Flowsheets (Taken 05/29/2024 1344) Pt Will Perform Lower Body Dressing:  with modified independence  with adaptive equipment  sitting/lateral leans Goal: Pt. Will Transfer To Toilet Flowsheets (Taken 05/29/2024 1344) Pt Will Transfer to Toilet: with modified independence Goal: Pt. Will Perform Toileting-Clothing Manipulation Flowsheets (Taken 05/29/2024 1344) Pt Will Perform Toileting - Clothing Manipulation and hygiene: with modified independence Goal: Pt/Caregiver Will Perform Home Exercise Program Flowsheets (Taken 05/29/2024 1344) Pt/caregiver will Perform Home Exercise Program:  Increased strength  Left upper extremity  Independently  Quamel Fitzmaurice OT, MOT  "

## 2024-05-29 NOTE — Plan of Care (Signed)
  Problem: Safety: Goal: Non-violent Restraint(s) Outcome: Progressing   Problem: Education: Goal: Knowledge of General Education information will improve Description: Including pain rating scale, medication(s)/side effects and non-pharmacologic comfort measures Outcome: Progressing   Problem: Health Behavior/Discharge Planning: Goal: Ability to manage health-related needs will improve Outcome: Progressing   Problem: Clinical Measurements: Goal: Ability to maintain clinical measurements within normal limits will improve Outcome: Progressing Goal: Will remain free from infection Outcome: Progressing Goal: Diagnostic test results will improve Outcome: Progressing Goal: Respiratory complications will improve Outcome: Progressing Goal: Cardiovascular complication will be avoided Outcome: Progressing   Problem: Activity: Goal: Risk for activity intolerance will decrease Outcome: Progressing   Problem: Nutrition: Goal: Adequate nutrition will be maintained Outcome: Progressing   Problem: Coping: Goal: Level of anxiety will decrease Outcome: Progressing   Problem: Elimination: Goal: Will not experience complications related to bowel motility Outcome: Progressing Goal: Will not experience complications related to urinary retention Outcome: Progressing   Problem: Pain Managment: Goal: General experience of comfort will improve and/or be controlled Outcome: Progressing   Problem: Safety: Goal: Ability to remain free from injury will improve Outcome: Progressing   Problem: Skin Integrity: Goal: Risk for impaired skin integrity will decrease Outcome: Progressing   Problem: Education: Goal: Ability to describe self-care measures that may prevent or decrease complications (Diabetes Survival Skills Education) will improve Outcome: Progressing Goal: Individualized Educational Video(s) Outcome: Progressing   Problem: Coping: Goal: Ability to adjust to condition or change  in health will improve Outcome: Progressing   Problem: Fluid Volume: Goal: Ability to maintain a balanced intake and output will improve Outcome: Progressing   Problem: Health Behavior/Discharge Planning: Goal: Ability to identify and utilize available resources and services will improve Outcome: Progressing Goal: Ability to manage health-related needs will improve Outcome: Progressing   Problem: Metabolic: Goal: Ability to maintain appropriate glucose levels will improve Outcome: Progressing   Problem: Nutritional: Goal: Maintenance of adequate nutrition will improve Outcome: Progressing Goal: Progress toward achieving an optimal weight will improve Outcome: Progressing   Problem: Skin Integrity: Goal: Risk for impaired skin integrity will decrease Outcome: Progressing   Problem: Tissue Perfusion: Goal: Adequacy of tissue perfusion will improve Outcome: Progressing

## 2024-05-29 NOTE — Evaluation (Signed)
 Occupational Therapy Evaluation Patient Details Name: Lynn Andrade MRN: [id number] DOB: 05/16/1970 Today's Date: 05/29/2024   History of Present Illness   Lynn Andrade is a 54 y.o. female with medical history significant for  morbid obesity, alcohol abuse, GERD, HTN, DM2, liver cirrhosis and depressive disorder presents to the ED with confusion and disorientation--per EDP EMS reported the patient has been drinking alcohol for the last couple days--mostly vodka  - No fevers reported no vomiting or diarrhea reported  - No significant cough noted  - At this time patient is restless, confused and disoriented and unable to provide additional history (per MD)     Clinical Impressions Pt agreeable to OT and PT co-evaluation. Pt able to complete bed mobility with min A and functional ambulation with CGA to supervision without AD. Pt demonstrates L shoulder weakness that is new to the patient. Patient is assisted for dressing socks at baseline but is able to complete all other ADL's. Similar functional level observed today for ADL's. Pt left in the chair with call bell within reach. Pt will benefit from continued OT in the hospital to increase strength, balance, and endurance for safe ADL's.      If plan is discharge home, recommend the following:   A little help with walking and/or transfers;A little help with bathing/dressing/bathroom;Help with stairs or ramp for entrance     Functional Status Assessment   Patient has had a recent decline in their functional status and demonstrates the ability to make significant improvements in function in a reasonable and predictable amount of time.     Equipment Recommendations   None recommended by OT             Precautions/Restrictions   Precautions Precautions: Fall Recall of Precautions/Restrictions: Intact Restrictions Weight Bearing Restrictions Per Provider Order: No     Mobility Bed Mobility Overal bed mobility: Needs  Assistance Bed Mobility: Supine to Sit     Supine to sit: Min assist     General bed mobility comments: Single hand held assist to pull to sit.    Transfers Overall transfer level: Needs assistance   Transfers: Sit to/from Stand, Bed to chair/wheelchair/BSC Sit to Stand: Contact guard assist, Supervision     Step pivot transfers: Supervision, Contact guard assist     General transfer comment: EOB to chair without AD; labored effort.      Balance Overall balance assessment: Needs assistance Sitting-balance support: No upper extremity supported, Feet supported Sitting balance-Leahy Scale: Fair Sitting balance - Comments: fair to good seated at EOB.   Standing balance support: No upper extremity supported, During functional activity Standing balance-Leahy Scale: Fair Standing balance comment: without AD                           ADL either performed or assessed with clinical judgement   ADL Overall ADL's : Needs assistance/impaired     Grooming: Supervision/safety;Standing       Lower Body Bathing: Minimal assistance;Sitting/lateral leans;Sit to/from stand   Upper Body Dressing : Modified independent;Set up;Sitting   Lower Body Dressing: Maximal assistance;Sitting/lateral leans Lower Body Dressing Details (indicate cue type and reason): Required assist to don socks as she does at baseline. Toilet Transfer: Therapist, Nutritional Details (indicate cue type and reason): Ambulatory transfer to toilet. Toileting- Clothing Manipulation and Hygiene: Modified independent;Sitting/lateral lean;Sit to/from stand;Supervision/safety       Functional mobility during ADLs: Contact guard assist;Supervision/safety General ADL Comments: Able to  ambulate in the hall without AD, but required a rest and was wheeled back to the room due to fatigue.     Vision Baseline Vision/History: 1 Wears glasses Ability to See in Adequate  Light: 0 Adequate Patient Visual Report: No change from baseline Vision Assessment?: No apparent visual deficits     Perception Perception: Not tested       Praxis Praxis: Not tested       Pertinent Vitals/Pain Pain Assessment Pain Assessment: 0-10 Pain Score: 6  Pain Location: B hips Pain Descriptors / Indicators: Aching, Dull Pain Intervention(s): Limited activity within patient's tolerance, Monitored during session, Repositioned     Extremity/Trunk Assessment Upper Extremity Assessment Upper Extremity Assessment: Generalized weakness;LUE deficits/detail LUE Deficits / Details: 3+/5 shoulder flexion and abduction which the pt reports is new for her. Pt states that this may have happended as a result of her fall.   Lower Extremity Assessment Lower Extremity Assessment: Defer to PT evaluation   Cervical / Trunk Assessment Cervical / Trunk Assessment: Normal   Communication Communication Communication: No apparent difficulties   Cognition Arousal: Alert Behavior During Therapy: WFL for tasks assessed/performed Cognition: No apparent impairments                               Following commands: Intact       Cueing  General Comments   Cueing Techniques: Verbal cues;Tactile cues                 Home Living Family/patient expects to be discharged to:: Private residence Living Arrangements: Children Available Help at Discharge: Family;Available PRN/intermittently Type of Home: House Home Access: Stairs to enter [employer] of Steps: 5 front; 1 + 1 in back Entrance Stairs-Rails: Right;Left;Can reach both Home Layout: One level     Bathroom Shower/Tub: Producer, Television/film/video: Handicapped height Bathroom Accessibility: Yes How Accessible: Accessible via wheelchair;Accessible via walker Home Equipment: None          Prior Functioning/Environment Prior Level of Function : Driving;History of Falls (last six  months);Needs assist (1 fall)       Physical Assist : ADLs (physical)   ADLs (physical): Dressing Mobility Comments: Community ambulator without AD; works; assisted to clear channel communications by son. ADLs Comments: Independent    OT Problem List: Decreased strength;Decreased activity tolerance;Impaired balance (sitting and/or standing);Obesity   OT Treatment/Interventions: Self-care/ADL training;Therapeutic exercise;Therapeutic activities;Patient/family education;Balance training;DME and/or AE instruction      OT Goals(Current goals can be found in the care plan section)   Acute Rehab OT Goals Patient Stated Goal: Improve function. OT Goal Formulation: With patient Time For Goal Achievement: 06/12/24 Potential to Achieve Goals: Good   OT Frequency:  Min 2X/week    Co-evaluation PT/OT/SLP Co-Evaluation/Treatment: Yes Reason for Co-Treatment: To address functional/ADL transfers   OT goals addressed during session: ADL's and self-care                       End of Session    Activity Tolerance: Patient tolerated treatment well Patient left: in chair;with call bell/phone within reach  OT Visit Diagnosis: Unsteadiness on feet (R26.81);Other abnormalities of gait and mobility (R26.89);Muscle weakness (generalized) (M62.81);History of falling (Z91.81)                Time: 1000-1021 OT Time Calculation (min): 21 min Charges:  OT General Charges $OT Visit: 1 Visit OT Evaluation $OT Eval Low Complexity: 1  Low  Lamoyne Hessel OT, MOT  Jayson Person 05/29/2024, 1:42 PM

## 2024-05-30 LAB — RENAL FUNCTION PANEL
Albumin: 2.7 g/dL — ABNORMAL LOW (ref 3.5–5.0)
Anion gap: 9 (ref 5–15)
BUN: 12 mg/dL (ref 6–20)
CO2: 23 mmol/L (ref 22–32)
Calcium: 7.9 mg/dL — ABNORMAL LOW (ref 8.9–10.3)
Chloride: 109 mmol/L (ref 98–111)
Creatinine, Ser: 0.86 mg/dL (ref 0.44–1.00)
GFR, Estimated: >60 mL/min
Glucose, Bld: 84 mg/dL (ref 70–99)
Phosphorus: 2.7 mg/dL (ref 2.5–4.6)
Potassium: 3.4 mmol/L — ABNORMAL LOW (ref 3.5–5.1)
Sodium: 142 mmol/L (ref 135–145)

## 2024-05-30 LAB — CBC
HCT: 21.7 % — ABNORMAL LOW (ref 36.0–46.0)
Hemoglobin: 6.9 g/dL — CL (ref 12.0–15.0)
MCH: 30.7 pg (ref 26.0–34.0)
MCHC: 31.8 g/dL (ref 30.0–36.0)
MCV: 96.4 fL (ref 80.0–100.0)
Platelets: 99 10*3/uL — ABNORMAL LOW (ref 150–400)
RBC: 2.25 MIL/uL — ABNORMAL LOW (ref 3.87–5.11)
RDW: 17.2 % — ABNORMAL HIGH (ref 11.5–15.5)
WBC: 3.1 10*3/uL — ABNORMAL LOW (ref 4.0–10.5)
nRBC: 0 % (ref 0.0–0.2)

## 2024-05-30 LAB — GLUCOSE, CAPILLARY
Glucose-Capillary: 68 mg/dL — ABNORMAL LOW (ref 70–99)
Glucose-Capillary: 79 mg/dL (ref 70–99)
Glucose-Capillary: 87 mg/dL (ref 70–99)
Glucose-Capillary: 89 mg/dL (ref 70–99)
Glucose-Capillary: 84 mg/dL (ref 70–99)

## 2024-05-30 LAB — PREPARE RBC (CROSSMATCH)

## 2024-05-30 MED ORDER — PANTOPRAZOLE SODIUM 40 MG PO TBEC
40.0000 mg | DELAYED_RELEASE_TABLET | Freq: Every day | ORAL | Status: DC
  Administered 2024-05-30 – 2024-05-31 (×2): 40 mg via ORAL
  Filled 2024-05-30 (×2): qty 1

## 2024-05-30 MED ORDER — FUROSEMIDE 10 MG/ML IJ SOLN
20.0000 mg | Freq: Once | INTRAMUSCULAR | Status: AC
  Administered 2024-05-30: 20 mg via INTRAVENOUS
  Filled 2024-05-30: qty 2

## 2024-05-30 MED ORDER — SODIUM CHLORIDE 0.9% IV SOLUTION: Freq: Once | INTRAVENOUS | Status: AC

## 2024-05-30 MED ORDER — SODIUM CHLORIDE 0.9% IV SOLUTION: Freq: Once | INTRAVENOUS | Status: DC

## 2024-05-30 NOTE — Progress Notes (Signed)
 " PROGRESS NOTE  Lynn Andrade, is a 54 y.o. female, DOB - 1971/03/12, FMW:[id number]  Admit date - 05/28/2024   Admitting Physician Lynn Marchena Pearlean, MD  Outpatient Primary MD for the patient is Lynn Frederick, PA-C  LOS - 2  Chief Complaint  Patient presents with   Alcohol Intoxication      Brief Narrative:  54 y.o. female with medical history significant for morbid obesity, alcohol abuse, GERD, HTN, DM2, liver cirrhosis and depressive disorder admitted on 05/28/2024 with acute hepatic encephalopathy with serum ammonia of 199 in setting of ongoing alcohol abuse    -Assessment and Plan: 1)Acute Hepatic Encephalopathy with significant confusion and disorientation -- Serum ammonia is 199, it was 100 on 05/19/2024 -Patient has improved significantly Primus back to baseline after treatment with lactulose  enemas and oral lactulose    2)Liver Cirrhosis--??  Liver cirrhosis possibly due to a combination of fatty liver and alcohol abuse  -- Continue PTA Lasix  , added Aldactone   -- avoid hepatotoxic agents   3)Acute on chronic chronic anemia---hemoglobin down to 6.9 from 9.7 on admission  - Recent baseline between 9 and 10  -prior workup suggest some component of iron deficiency  --continue iron replacement --Transfuse 1 unit of PRBC on 05/30/2024   4) chronic thrombocytopenia/Pancytopenia--platelets  similar to prior Related to underlying liver cirrhosis compounded by direct toxic effect of alcohol on the bone marrow - No bleeding concerns at this time -Acute on chronic anemia as above #3   5)Hypothyroidism--continue levothyroxine    6)Alcohol,Abuse--- apparently she drinks a lot of vodka -- High risk for DTs - Lorazepam  per CIWA protocol, -- c/n folic acid  and thiamine  and multivitamin as ordered   7)GERD--- continue Protonix    8)DM2--A1c previously 4.2--reflecting excellent diabetic control PTA with risk for hypoglycemia -Hold Farxiga - Use Novolog /Humalog Sliding scale insulin  with  Accu-Cheks/Fingersticks as ordered   9)Morbid Obesity- -Low calorie diet, portion control and increase physical activity discussed with patient -Body mass index is 56.46 kg/m.  10)Generalized weakness and deconditioning--PT eval appreciated recommends home health physical therapy  Status is: Inpatient   Disposition: The patient is from: Home              Anticipated d/c is to: Home              Anticipated d/c date is: 1 day              Patient currently is not medically stable to d/c. Barriers: Not Clinically Stable-   Code Status :  -  Code Status: Full Code   Family Communication:    NA (patient is alert, awake and coherent)   DVT Prophylaxis  :   - SCDs   SCDs Start: 05/28/24 1339 Place TED hose Start: 05/28/24 1339   Lab Results  Component Value Date   PLT 99 (L) 05/30/2024   Inpatient Medications  Scheduled Meds:  sodium chloride    Intravenous Once   sodium chloride    Intravenous Once   ferrous sulfate   325 mg Oral Q breakfast   folic acid   1 mg Oral Daily   furosemide   20 mg Intravenous Once   furosemide   40 mg Oral Daily   insulin  aspart  0-5 Units Subcutaneous QHS   insulin  aspart  0-6 Units Subcutaneous TID WC   lactulose   20 g Oral TID   levothyroxine   25 mcg Oral Q0600   multivitamin with minerals  1 tablet Oral Daily   pantoprazole   40 mg Oral Daily   spironolactone   25  mg Oral Daily   thiamine   100 mg Oral Daily   Or   thiamine   100 mg Intravenous Daily   Continuous Infusions: PRN Meds:.acetaminophen  **OR** acetaminophen , albuterol , bisacodyl , LORazepam  **OR** LORazepam , ondansetron  **OR** ondansetron  (ZOFRAN ) IV, polyethylene glycol, sodium chloride  flush, traZODone    Anti-infectives (From admission, onward)    None       Subjective: Lynn Andrade today has no fevers, no emesis,  No chest pain,   - No new concerns  --tolerating oral intake well - Complains of fatigue and generalized weakness -- No bleeding  concerns   Objective: Vitals:   05/29/24 0537 05/29/24 1431 05/29/24 2033 05/30/24 0148  BP: (!) 127/54 (!) 122/56 (!) 142/69 122/66  Pulse: 91 78 93 82  Resp: 19 18 18 18   Temp: 99.1 F (37.3 C) 98.4 F (36.9 C) 98.8 F (37.1 C) 98.4 F (36.9 C)  TempSrc: Axillary Oral Oral Oral  SpO2: 100% 100% 96% 100%  Weight:      Height:        Intake/Output Summary (Last 24 hours) at 05/30/2024 0950 Last data filed at 05/30/2024 0600 Gross per 24 hour  Intake 880 ml  Output --  Net 880 ml   Filed Weights   05/28/24 0947 05/28/24 1738  Weight: 129.7 kg (!) 153.9 kg   Physical Exam Gen:- Awake Alert, morbidly obese, cooperative HEENT:- [place].AT,  Mild sclera icterus Neck-Supple Neck,No JVD,.  Lungs-  CTAB , fair symmetrical air movement CV- S1, S2 normal, regular  Abd-  +ve B.Sounds, Abd Soft, No tenderness, increased truncal adiposity Extremity/Skin:- mild edema, pedal pulses present  Psych-affect is appropriate, oriented x3 Neuro-generalized weakness, no new focal deficits, no significant tremors  Data Reviewed: I have personally reviewed following labs and imaging studies  CBC: Recent Labs  Lab 05/28/24 0955 05/29/24 0418 05/30/24 0427  WBC 7.3 3.6* 3.1*  NEUTROABS 6.2  --   --   HGB 9.7* 7.3* 6.9*  HCT 29.5* 22.6* 21.7*  MCV 94.2 96.2 96.4  PLT 136* 104* 99*   Basic Metabolic Panel: Recent Labs  Lab 05/28/24 0955 05/29/24 0418 05/30/24 0427  NA 144 144 142  K 4.3 3.0* 3.4*  CL 106 111 109  CO2 24 25 23   GLUCOSE 106* 66* 84  BUN 15 15 12   CREATININE 0.98 0.82 0.86  CALCIUM 8.7* 8.1* 7.9*  MG  --  2.0  --   PHOS  --   --  2.7   GFR: Estimated Creatinine Clearance: 114.4 mL/min (by C-G formula based on SCr of 0.86 mg/dL). Liver Function Tests: Recent Labs  Lab 05/28/24 0955 05/29/24 0418 05/30/24 0427  AST 40 56*  --   ALT 20 19  --   ALKPHOS 89 71  --   BILITOT 2.5* 1.8*  --   PROT 6.9 5.3*  --   ALBUMIN  3.2* 2.5* 2.7*   BNP (last 3  results) Recent Labs    02/09/24 1247 05/28/24 0955  PROBNP 935.0* 687.0*   Recent Results (from the past 240 hours)  Culture, blood (routine x 2)     Status: None (Preliminary result)   Collection Time: 05/28/24 11:13 AM   Specimen: BLOOD  Result Value Ref Range Status   Specimen Description BLOOD BLOOD RIGHT HAND  Final   Special Requests   Final    BOTTLES DRAWN AEROBIC AND ANAEROBIC Blood Culture adequate volume   Culture   Final    NO GROWTH 2 DAYS Performed at [facility], [address] [place], [place] [address]    Report Status PENDING  Incomplete  Culture, blood (routine x 2)     Status: None (Preliminary result)   Collection Time: 05/28/24 11:13 AM   Specimen: BLOOD  Result Value Ref Range Status   Specimen Description BLOOD BLOOD RIGHT FOREARM  Final   Special Requests   Final    BOTTLES DRAWN AEROBIC AND ANAEROBIC Blood Culture adequate volume   Culture   Final    NO GROWTH 2 DAYS Performed at [facility], [address], [place], [place] [address]    Report Status PENDING  Incomplete    Radiology Studies: CT Head Wo Contrast Result Date: 05/28/2024 EXAM: CT HEAD WITHOUT CONTRAST 05/28/2024 10:35:48 AM TECHNIQUE: CT of the head was performed without the administration of intravenous contrast. Automated exposure control, iterative reconstruction, and/or weight based adjustment of the mA/kV was utilized to reduce the radiation dose to as low as reasonably achievable. COMPARISON: Head CT 01/25/2022. CLINICAL HISTORY: 54 year old female with excessive drinking and altered mental status. FINDINGS: BRAIN AND VENTRICLES: No acute hemorrhage. No evidence of acute infarct. No hydrocephalus. No extra-axial collection. No mass effect or midline shift. Brain volume remains normal. Chronic partially empty sella, nonspecific and significance doubtful. There is patchy very mild for age bilateral cerebral white matter hypodensity, not significantly changed. No suspicious intracranial  vascular hyperdensity. ORBITS: No gaze deviation. SINUSES: Paranasal sinuses, tympanic cavities and mastoids are clear. SOFT TISSUES AND SKULL: No acute soft tissue abnormality. No skull fracture. IMPRESSION: 1. Stable since 2023, largely negative for age non-contrast CT appearance of the brain. Electronically signed by: Helayne Hurst MD 05/28/2024 10:41 AM EST RP Workstation: [id number]   DG Chest Portable 1 View Result Date: 05/28/2024 CLINICAL DATA:  Altered mental status.  Weakness. EXAM: PORTABLE CHEST 1 VIEW COMPARISON:  Radiographs 02/09/2024 and 01/18/2024 FINDINGS: 1009 hours. Lower lung volumes. The heart size and mediastinal contours are stable with mild cardiac enlargement. Increased vascular congestion without confluent airspace disease, overt pulmonary edema, pneumothorax or significant pleural effusion. The bones appear unremarkable. Telemetry leads overlie the chest. IMPRESSION: Lower lung volumes with increased vascular congestion. No overt pulmonary edema or focal airspace disease. Electronically Signed   By: Elsie Perone M.D.   On: 05/28/2024 10:36   Scheduled Meds:  sodium chloride    Intravenous Once   sodium chloride    Intravenous Once   ferrous sulfate   325 mg Oral Q breakfast   folic acid   1 mg Oral Daily   furosemide   20 mg Intravenous Once   furosemide   40 mg Oral Daily   insulin  aspart  0-5 Units Subcutaneous QHS   insulin  aspart  0-6 Units Subcutaneous TID WC   lactulose   20 g Oral TID   levothyroxine   25 mcg Oral Q0600   multivitamin with minerals  1 tablet Oral Daily   pantoprazole   40 mg Oral Daily   spironolactone   25 mg Oral Daily   thiamine   100 mg Oral Daily   Or   thiamine   100 mg Intravenous Daily   Continuous Infusions:   LOS: 2 days   Rendall Carwin M.D on 05/30/2024 at 9:50 AM  Go to [email or web address] - for contact info  [facility] - Office  [phone]  If 7PM-7AM, please contact night-coverage [email or web address] 05/30/2024, 9:50 AM    "

## 2024-05-30 NOTE — Plan of Care (Signed)
" °  Problem: Education: Goal: Knowledge of General Education information will improve Description: Including pain rating scale, medication(s)/side effects and non-pharmacologic comfort measures Outcome: Progressing   Problem: Activity: Goal: Risk for activity intolerance will decrease Outcome: Progressing   Problem: Pain Managment: Goal: General experience of comfort will improve and/or be controlled Outcome: Progressing   Problem: Safety: Goal: Ability to remain free from injury will improve Outcome: Progressing   Problem: Education: Goal: Ability to describe self-care measures that may prevent or decrease complications (Diabetes Survival Skills Education) will improve Outcome: Progressing   Problem: Metabolic: Goal: Ability to maintain appropriate glucose levels will improve Outcome: Progressing   Problem: Skin Integrity: Goal: Risk for impaired skin integrity will decrease Outcome: Progressing   "

## 2024-05-31 LAB — COMPREHENSIVE METABOLIC PANEL WITH GFR
ALT: 32 U/L (ref 0–44)
AST: 74 U/L — ABNORMAL HIGH (ref 15–41)
Albumin: 2.7 g/dL — ABNORMAL LOW (ref 3.5–5.0)
Alkaline Phosphatase: 72 U/L (ref 38–126)
Anion gap: 8 (ref 5–15)
BUN: 11 mg/dL (ref 6–20)
CO2: 25 mmol/L (ref 22–32)
Calcium: 7.9 mg/dL — ABNORMAL LOW (ref 8.9–10.3)
Chloride: 106 mmol/L (ref 98–111)
Creatinine, Ser: 0.87 mg/dL (ref 0.44–1.00)
GFR, Estimated: >60 mL/min
Glucose, Bld: 90 mg/dL (ref 70–99)
Potassium: 3.7 mmol/L (ref 3.5–5.1)
Sodium: 139 mmol/L (ref 135–145)
Total Bilirubin: 1.6 mg/dL — ABNORMAL HIGH (ref 0.0–1.2)
Total Protein: 5.5 g/dL — ABNORMAL LOW (ref 6.5–8.1)

## 2024-05-31 LAB — CBC
HCT: 25.2 % — ABNORMAL LOW (ref 36.0–46.0)
Hemoglobin: 8.1 g/dL — ABNORMAL LOW (ref 12.0–15.0)
MCH: 30.9 pg (ref 26.0–34.0)
MCHC: 32.1 g/dL (ref 30.0–36.0)
MCV: 96.2 fL (ref 80.0–100.0)
Platelets: 94 10*3/uL — ABNORMAL LOW (ref 150–400)
RBC: 2.62 MIL/uL — ABNORMAL LOW (ref 3.87–5.11)
RDW: 16.6 % — ABNORMAL HIGH (ref 11.5–15.5)
WBC: 2.9 10*3/uL — ABNORMAL LOW (ref 4.0–10.5)
nRBC: 0 % (ref 0.0–0.2)

## 2024-05-31 LAB — GLUCOSE, CAPILLARY
Glucose-Capillary: 86 mg/dL (ref 70–99)
Glucose-Capillary: 108 mg/dL — ABNORMAL HIGH (ref 70–99)

## 2024-05-31 LAB — BPAM RBC
Blood Product Expiration Date: 202602212359
ISSUE DATE / TIME: 202601281107
Unit Type and Rh: 6200

## 2024-05-31 LAB — TYPE AND SCREEN
ABO/RH(D): A POS
Antibody Screen: NEGATIVE
Unit division: 0

## 2024-05-31 MED ORDER — SERTRALINE HCL 50 MG PO TABS: 50.0000 mg | ORAL_TABLET | Freq: Every day | ORAL | 5 refills | Status: DC

## 2024-05-31 MED ORDER — FEROSUL 325 (65 FE) MG PO TABS: 325.0000 mg | ORAL_TABLET | Freq: Every day | ORAL | 5 refills | Status: DC

## 2024-05-31 MED ORDER — SERTRALINE HCL 50 MG PO TABS: 50.0000 mg | ORAL_TABLET | Freq: Every day | ORAL | 5 refills | Status: AC

## 2024-05-31 MED ORDER — RIFAXIMIN 550 MG PO TABS: 550.0000 mg | ORAL_TABLET | Freq: Two times a day (BID) | ORAL | 5 refills | Status: DC

## 2024-05-31 MED ORDER — LEVOTHYROXINE SODIUM 25 MCG PO TABS: 25.0000 ug | ORAL_TABLET | Freq: Every day | ORAL | 5 refills | Status: DC

## 2024-05-31 MED ORDER — FUROSEMIDE 40 MG PO TABS: 40.0000 mg | ORAL_TABLET | Freq: Every day | ORAL | 5 refills | Status: DC

## 2024-05-31 MED ORDER — PANTOPRAZOLE SODIUM 40 MG PO TBEC: 40.0000 mg | DELAYED_RELEASE_TABLET | Freq: Every day | ORAL | 5 refills | Status: AC

## 2024-05-31 MED ORDER — PANTOPRAZOLE SODIUM 40 MG PO TBEC: 40.0000 mg | DELAYED_RELEASE_TABLET | Freq: Every day | ORAL | 5 refills | Status: DC

## 2024-05-31 MED ORDER — ALBUTEROL SULFATE HFA 108 (90 BASE) MCG/ACT IN AERS: 2.0000 | INHALATION_SPRAY | RESPIRATORY_TRACT | 3 refills | Status: DC | PRN

## 2024-05-31 MED ORDER — LACTULOSE 10 GM/15ML PO SOLN: 20.0000 g | Freq: Two times a day (BID) | ORAL | 3 refills | Status: AC

## 2024-05-31 MED ORDER — LEVOTHYROXINE SODIUM 25 MCG PO TABS: 25.0000 ug | ORAL_TABLET | Freq: Every day | ORAL | 5 refills | Status: AC

## 2024-05-31 MED ORDER — FUROSEMIDE 40 MG PO TABS: 40.0000 mg | ORAL_TABLET | Freq: Every day | ORAL | 5 refills | Status: AC

## 2024-05-31 MED ORDER — CHOLECALCIFEROL 1.25 MG (50000 UT) PO CAPS: 50000.0000 [IU] | ORAL_CAPSULE | ORAL | 5 refills | Status: DC

## 2024-05-31 MED ORDER — LACTULOSE 10 GM/15ML PO SOLN: 20.0000 g | Freq: Two times a day (BID) | ORAL | 3 refills | Status: DC

## 2024-05-31 MED ORDER — MULTI-VITAMIN/MINERALS PO TABS: 1.0000 | ORAL_TABLET | Freq: Every day | ORAL | 2 refills | Status: DC

## 2024-05-31 MED ORDER — RIFAXIMIN 550 MG PO TABS: 550.0000 mg | ORAL_TABLET | Freq: Two times a day (BID) | ORAL | 5 refills | Status: AC

## 2024-05-31 MED ORDER — DAPAGLIFLOZIN PROPANEDIOL 10 MG PO TABS: 10.0000 mg | ORAL_TABLET | Freq: Every day | ORAL | 3 refills | Status: DC

## 2024-05-31 MED ORDER — MULTI-VITAMIN/MINERALS PO TABS: 1.0000 | ORAL_TABLET | Freq: Every day | ORAL | 2 refills | Status: AC

## 2024-05-31 MED ORDER — FEROSUL 325 (65 FE) MG PO TABS: 325.0000 mg | ORAL_TABLET | Freq: Every day | ORAL | 5 refills | Status: AC

## 2024-05-31 MED ORDER — ALBUTEROL SULFATE HFA 108 (90 BASE) MCG/ACT IN AERS: 2.0000 | INHALATION_SPRAY | RESPIRATORY_TRACT | 3 refills | Status: AC | PRN

## 2024-05-31 MED ORDER — SPIRONOLACTONE 25 MG PO TABS: 25.0000 mg | ORAL_TABLET | Freq: Every day | ORAL | 5 refills | Status: AC

## 2024-05-31 MED ORDER — TRAZODONE HCL 50 MG PO TABS: 50.0000 mg | ORAL_TABLET | Freq: Every day | ORAL | 5 refills | Status: AC

## 2024-05-31 MED ORDER — DAPAGLIFLOZIN PROPANEDIOL 10 MG PO TABS: 10.0000 mg | ORAL_TABLET | Freq: Every day | ORAL | 3 refills | Status: AC

## 2024-05-31 MED ORDER — TRAZODONE HCL 50 MG PO TABS: 50.0000 mg | ORAL_TABLET | Freq: Every day | ORAL | 5 refills | Status: DC

## 2024-05-31 MED ORDER — SPIRONOLACTONE 25 MG PO TABS: 25.0000 mg | ORAL_TABLET | Freq: Every day | ORAL | 5 refills | Status: DC

## 2024-05-31 MED ORDER — CHOLECALCIFEROL 1.25 MG (50000 UT) PO CAPS: 50000.0000 [IU] | ORAL_CAPSULE | ORAL | 5 refills | Status: AC

## 2024-05-31 NOTE — TOC Transition Note (Signed)
 Transition of Care [facility]) - Discharge Note   Patient Details  Name: Lynn Andrade MRN: [id number] Date of Birth: 02-22-1971  Transition of Care [facility]) CM/SW Contact:  Hoy DELENA Bigness, LCSW Phone Number: 05/31/2024, 11:32 AM   Clinical Narrative:    Pt provided with list of local AA meetings and [facility] voucher for medications.    Final next level of care: Home/Self Care Barriers to Discharge: Barriers Resolved   Patient Goals and CMS Choice Patient states their goals for this hospitalization and ongoing recovery are:: return home   Choice offered to / list presented to : Patient [place] ownership interest in [facility].provided to::  (n/a)    Discharge Placement                       Discharge Plan and Services Additional resources added to the After Visit Summary for   In-house Referral: Clinical Social Work              DME Arranged: N/A DME Agency: NA       HH Arranged: Refused HH          Social Drivers of Health (SDOH) Interventions SDOH Screenings   Food Insecurity: Patient Unable To Answer (05/28/2024)  Housing: Unknown (05/28/2024)  Transportation Needs: Patient Unable To Answer (05/28/2024)  Utilities: Patient Unable To Answer (05/28/2024)  Financial Resource Strain: Low Risk (12/27/2023)   Received from [facility]  Tobacco Use: High Risk (05/28/2024)     Readmission Risk Interventions    05/29/2024    2:21 PM 05/28/2024    1:44 PM  Readmission Risk Prevention Plan  Transportation Screening Complete Complete  Home Care Screening  Complete  Medication Review (RN CM)  Complete  HRI or Home Care Consult Complete   Social Work Consult for Recovery Care Planning/Counseling Complete   Palliative Care Screening Not Applicable   Medication Review [employer]) Complete

## 2024-05-31 NOTE — Discharge Instructions (Signed)
 1)Complete Abstinence from Alcohol advised--consider AA meetings and other alcohol rehab programs 2)Avoid ibuprofen/Advil/Aleve/Motrin/Goody Powders/Naproxen/BC powders/Meloxicam/Diclofenac/Indomethacin and other Nonsteroidal anti-inflammatory medications as these will make you more likely to bleed and can cause stomach ulcers, can also cause Kidney problems.  3)Please take medications as prescribed 4)Follow-up Gastroenterologist Dr. Carlin Hasty with [facility]--- in 1 to 2 weeks for evaluation  -address: [address], [place], [place] [address], Phone: [phone] 5)Morbid Obesity- -Low calorie diet, portion control and increase physical activity discussed with patient -Body mass index is 56.46 kg/m. 6)Repeat CBC and CMP blood tests in about a week advised

## 2024-05-31 NOTE — TOC Progression Note (Signed)
 Transition of Care [facility]) - Progression Note    Patient Details  Name: Lynn Andrade MRN: [id number] Date of Birth: 1970/06/09  Transition of Care [facility]) CM/SW Contact  Sharlyne Stabs, RN Phone Number: 05/31/2024, 11:23 AM  Clinical Narrative:   IPCM consulted for [facility] Vouchers, Copy below, provided to patient.     MATCH MEDICATION ASSISTANCE CARD Pharmacies please call [phone] for claim processing assistance.  Rx BIN: [id number] Rx Group: [id number] Rx PCN: [id number] Relationship Code: 1 Person Code: 01  Patient ID (MRN): [id number]   Patient Name:  Lynn Andrade   Patient DOB: July 03, 1970   Discharge Date: 05/31/2024  Expiration Date: 06/08/2024 (must be filled within 7 days of discharge)      Expected Discharge Plan: Home/Self Care Barriers to Discharge: Continued Medical Work up     [facility] Arranged: Refused HH

## 2024-05-31 NOTE — Discharge Summary (Signed)
"                                                                                 Lynn Andrade, is a 54 y.o. female  DOB June 21, 1970  MRN [id number].  Admission date:  05/28/2024  Admitting Physician  Rendall Carwin, MD  Discharge Date:  05/31/2024   Primary MD  Samie Frederick, PA-C  Recommendations for primary care physician for things to follow:  1)Complete Abstinence from Alcohol advised--consider AA meetings and other alcohol rehab programs 2)Avoid ibuprofen/Advil/Aleve/Motrin/Goody Powders/Naproxen/BC powders/Meloxicam/Diclofenac/Indomethacin and other Nonsteroidal anti-inflammatory medications as these will make you more likely to bleed and can cause stomach ulcers, can also cause Kidney problems.  3)Please take medications as prescribed 4)Follow-up Gastroenterologist Dr. Carlin Hasty with [facility]--- in 1 to 2 weeks for evaluation  -address: [address] [place], [place] [address], Phone: [phone] 5)Morbid Obesity- -Low calorie diet, portion control and increase physical activity discussed with patient -Body mass index is 56.46 kg/m. 6)Repeat CBC and CMP blood tests in about a week advised  Admission Diagnosis  Hepatic encephalopathy (HCC) [K76.82] Acute hepatic encephalopathy (HCC) [K76.82]   Discharge Diagnosis  Hepatic encephalopathy (HCC) [K76.82] Acute hepatic encephalopathy (HCC) [K76.82]    Principal Problem:   Acute hepatic encephalopathy (HCC) Active Problems:   Morbid obesity (HCC)   HTN (hypertension)   Gastro-esophageal reflux disease without esophagitis   Obstructive sleep apnea (adult) (pediatric)   Hyperammonemic encephalopathy   Hyperammonemia      Past Medical History:  Diagnosis Date   Acid reflux    Adjustment disorder with depressed mood 10/27/2009   Formatting of this note might be different from the original. Overview:  Adjustment Disorder With Depressed Mood   Chronic congestive heart failure (HCC)    Diabetes  mellitus with coincident hypertension (HCC) 01/13/2022   Diabetes mellitus without complication (HCC)    Edema    Essential hypertension 08/31/2010   Formatting of this note might be different from the original. Hypertension Formatting of this note might be different from the original. Overview:  Hypertension   Fluid retention    Hypertension     Past Surgical History:  Procedure Laterality Date   BILIOPANCREATIC DIVISION W DUODENAL SWITCH     CHOLECYSTECTOMY     DILATION AND CURETTAGE OF UTERUS     TONSILLECTOMY       HPI  from the history and physical done on the day of admission:   HPI: Lynn Andrade is a 54 y.o. female with medical history significant for  morbid obesity, alcohol abuse, GERD, HTN, DM2, liver cirrhosis and depressive disorder presents to the ED with confusion and disorientation--per EDP EMS reported the patient has been drinking alcohol for the last couple days--mostly vodka - No fevers reported no vomiting or diarrhea reported - No significant cough noted - At this time patient is restless, confused and disoriented and unable to provide additional history - In the ED UDS is negative, UA not suggestive of UTI - Serum ammonia is 199, it was 100 on 05/19/2024 - Lactic acid is down to 1.3 from 2.0 - Blood alcohol level is negative - Serum acetaminophen  level negative - LFTs are not elevated  except for T. bili of 2.5 which is similar to prior WBC 7.3 hemoglobin 9.7 which is similar to prior platelets 136 which is similar to prior   Review of Systems: As mentioned in the history of present illness. All other systems reviewed and are negative.    Hospital Course:     Brief Narrative:  54 y.o. female with medical history significant for morbid obesity, alcohol abuse, GERD, HTN, DM2, liver cirrhosis and depressive disorder admitted on 05/28/2024 with acute hepatic encephalopathy with serum ammonia of 199 in setting of ongoing alcohol abuse     -Assessment and  Plan: 1)Acute Hepatic Encephalopathy with significant confusion and disorientation -- Serum ammonia is 199, it was 100 on 05/19/2024 -Patient has improved significantly and mentation is  back to baseline after treatment with lactulose  enemas and oral lactulose  - Hepatic encephalopathy is resolved   2)Liver Cirrhosis--??  Liver cirrhosis possibly due to a combination of fatty liver and alcohol abuse  -- Continue PTA Lasix  , added Aldactone   -- avoid hepatotoxic agents -Discharge on lactulose  and rifaximin , Aldactone  -Continue PTA Lasix - -Repeat CMP in about 1 week advised   3)Acute on chronic chronic anemia---hemoglobin down to 6.9 from 9.7 on admission  - Recent baseline between 9 and 10  -prior workup suggest some component of iron deficiency  --continue iron replacement -Hgb is 8.1 after transfusion of 1 unit of PRBC this admission -No obvious bleeding concerns at this time -Repeat CBC as outpatient within the next week advised -   4) chronic thrombocytopenia/Pancytopenia--platelets  similar to prior Related to underlying liver cirrhosis compounded by direct toxic effect of alcohol on the bone marrow - No bleeding concerns at this time -Acute on chronic anemia as above #3   5)Hypothyroidism--continue levothyroxine    6)Alcohol,Abuse--- apparently she drinks a lot of vodka -- -No DT type symptoms at this time -Abstinence from alcohol and alcohol rehab advised -Continue multivitamin with minerals   7)GERD--- continue Protonix    8)DM2--A1c previously 4.2--reflecting excellent diabetic control PTA with risk for hypoglycemia -Avoid prolonged fasting -continue PTA Farxiga    9)Morbid Obesity- -Low calorie diet, portion control and increase physical activity discussed with patient -Body mass index is 56.46 kg/m.   10)Generalized weakness and deconditioning--PT eval appreciated recommends home health physical therapy   Disposition: The patient is from: Home               Anticipated d/c is to: Home  Discharge Condition: Stable,  Follow UP   Follow-up Information     Samie Frederick, PA-C. Schedule an appointment as soon as possible for a visit in 1 week(s).   Specialty: Physician Assistant Why: Repeat CBC and CMP blood tests Contact information: [address] [place] [address] [phone]                Diet and Activity recommendation:  As advised  Discharge Instructions    Discharge Instructions     Call MD for:  difficulty breathing, headache or visual disturbances   Complete by: As directed    Call MD for:  persistant dizziness or light-headedness   Complete by: As directed    Call MD for:  persistant nausea and vomiting   Complete by: As directed    Call MD for:  temperature >100.4   Complete by: As directed    Diet - low sodium heart healthy   Complete by: As directed    Discharge instructions   Complete by: As directed    1)Complete Abstinence from  Alcohol advised--consider AA meetings and other alcohol rehab programs 2)Avoid ibuprofen/Advil/Aleve/Motrin/Goody Powders/Naproxen/BC powders/Meloxicam/Diclofenac/Indomethacin and other Nonsteroidal anti-inflammatory medications as these will make you more likely to bleed and can cause stomach ulcers, can also cause Kidney problems.  3)Please take medications as prescribed 4)Follow-up Gastroenterologist Dr. Carlin Hasty with [facility]--- in 1 to 2 weeks for evaluation  -address: [address] [place], [place] [address], Phone: [phone] 5)Morbid Obesity- -Low calorie diet, portion control and increase physical activity discussed with patient -Body mass index is 56.46 kg/m. 6)Repeat CBC and CMP blood tests in about a week advised   Increase activity slowly   Complete by: As directed         Discharge Medications     Allergies as of 05/31/2024       Reactions   Aspirin-caffeine Hives, Swelling   Tape Dermatitis   Paper tape is okay    Toradol [ketorolac Tromethamine] Other (See Comments)   Unknown    Etodolac Hives   Shrimp (diagnostic) Hives        Medication List     STOP taking these medications    metolazone  5 MG tablet Commonly known as: ZAROXOLYN        TAKE these medications    albuterol  108 (90 Base) MCG/ACT inhaler Commonly known as: VENTOLIN  HFA Inhale 2 puffs into the lungs every 4 (four) hours as needed for wheezing or shortness of breath (for allergies). What changed:  medication strength when to take this reasons to take this   Cholecalciferol  1.25 MG (50000 UT) capsule Take 1 capsule (50,000 Units total) by mouth once a week.   dapagliflozin  propanediol 10 MG Tabs tablet Commonly known as: Farxiga  Take 1 tablet (10 mg total) by mouth daily before breakfast.   FeroSul 325 (65 Fe) MG tablet Generic drug: ferrous sulfate  Take 1 tablet (325 mg total) by mouth daily with breakfast. What changed: when to take this   furosemide  40 MG tablet Commonly known as: LASIX  Take 1 tablet (40 mg total) by mouth daily. What changed: when to take this   lactulose  10 GM/15ML solution Commonly known as: CHRONULAC  Take 30 mLs (20 g total) by mouth 2 (two) times daily. What changed:  how much to take when to take this   levothyroxine  25 MCG tablet Commonly known as: SYNTHROID  Take 1 tablet (25 mcg total) by mouth daily.   multivitamin with minerals tablet Take 1 tablet by mouth daily.   Nasacort  Allergy 24HR 55 MCG/ACT Aero nasal inhaler Generic drug: triamcinolone  Place 2 sprays into the nose daily.   nystatin  powder Commonly known as: MYCOSTATIN /NYSTOP  Apply topically 3 (three) times daily.   pantoprazole  40 MG tablet Commonly known as: PROTONIX  Take 1 tablet (40 mg total) by mouth daily. Start taking on: June 01, 2024   rifaximin  550 MG Tabs tablet Commonly known as: XIFAXAN  Take 1 tablet (550 mg total) by mouth 2 (two) times daily.   sertraline  50 MG tablet Commonly  known as: ZOLOFT  Take 1 tablet (50 mg total) by mouth daily.   spironolactone  25 MG tablet Commonly known as: ALDACTONE  Take 1 tablet (25 mg total) by mouth daily. Start taking on: June 01, 2024   traZODone  50 MG tablet Commonly known as: DESYREL  Take 1 tablet (50 mg total) by mouth at bedtime.        Major procedures and Radiology Reports - PLEASE review detailed and final reports for all details, in brief -   CT Head Wo Contrast Result Date: 05/28/2024 EXAM:  CT HEAD WITHOUT CONTRAST 05/28/2024 10:35:48 AM TECHNIQUE: CT of the head was performed without the administration of intravenous contrast. Automated exposure control, iterative reconstruction, and/or weight based adjustment of the mA/kV was utilized to reduce the radiation dose to as low as reasonably achievable. COMPARISON: Head CT 01/25/2022. CLINICAL HISTORY: 54 year old female with excessive drinking and altered mental status. FINDINGS: BRAIN AND VENTRICLES: No acute hemorrhage. No evidence of acute infarct. No hydrocephalus. No extra-axial collection. No mass effect or midline shift. Brain volume remains normal. Chronic partially empty sella, nonspecific and significance doubtful. There is patchy very mild for age bilateral cerebral white matter hypodensity, not significantly changed. No suspicious intracranial vascular hyperdensity. ORBITS: No gaze deviation. SINUSES: Paranasal sinuses, tympanic cavities and mastoids are clear. SOFT TISSUES AND SKULL: No acute soft tissue abnormality. No skull fracture. IMPRESSION: 1. Stable since 2023, largely negative for age non-contrast CT appearance of the brain. Electronically signed by: Helayne Hurst MD 05/28/2024 10:41 AM EST RP Workstation: [id number]   DG Chest Portable 1 View Result Date: 05/28/2024 CLINICAL DATA:  Altered mental status.  Weakness. EXAM: PORTABLE CHEST 1 VIEW COMPARISON:  Radiographs 02/09/2024 and 01/18/2024 FINDINGS: 1009 hours. Lower lung volumes. The heart size and  mediastinal contours are stable with mild cardiac enlargement. Increased vascular congestion without confluent airspace disease, overt pulmonary edema, pneumothorax or significant pleural effusion. The bones appear unremarkable. Telemetry leads overlie the chest. IMPRESSION: Lower lung volumes with increased vascular congestion. No overt pulmonary edema or focal airspace disease. Electronically Signed   By: Elsie Perone M.D.   On: 05/28/2024 10:36   CT ABDOMEN PELVIS W CONTRAST Result Date: 05/19/2024 CLINICAL DATA:  Abdominal pain, vomiting, and diarrhea.  Lethargy. EXAM: CT ABDOMEN AND PELVIS WITH CONTRAST TECHNIQUE: Multidetector CT imaging of the abdomen and pelvis was performed using the standard protocol following bolus administration of intravenous contrast. RADIATION DOSE REDUCTION: This exam was performed according to the departmental dose-optimization program which includes automated exposure control, adjustment of the mA and/or kV according to patient size and/or use of iterative reconstruction technique. CONTRAST:  100mL OMNIPAQUE  IOHEXOL  300 MG/ML  SOLN COMPARISON:  None Available. FINDINGS: Lower chest: No acute abnormality. Hepatobiliary: A small cystic tubular structure is noted in the left lobe of the liver measuring 1.1 cm. Fatty infiltration of the liver is noted. The gallbladder is surgically absent. No extrahepatic biliary ductal dilatation. Pancreas: Unremarkable. No pancreatic ductal dilatation or surrounding inflammatory changes. Spleen: The spleen is borderline enlarged measuring 13 cm in length. Adrenals/Urinary Tract: The adrenal glands are within normal limits. The kidneys enhance symmetrically. A slightly complex exophytic lesion is noted in the upper pole of the right kidney measuring 1.5 cm. No renal calculus or hydronephrosis bilaterally. The bladder is unremarkable. Stomach/Bowel: There is a small hiatal hernia. Gastric surgical changes are noted. No bowel obstruction, free  air, or pneumatosis is seen. Appendix is not seen. Vascular/Lymphatic: Aortic atherosclerosis. Multiple varices are noted in the right upper and lower quadrants. There is a nonspecific prominent lymph node along the external iliac chain on the right measuring 1.2 cm. Reproductive: Status post hysterectomy. No adnexal masses. Other: No abdominopelvic ascites.  Diffuse anasarca is noted. Musculoskeletal: Degenerative changes are present in the thoracolumbar spine. No acute osseous abnormality. IMPRESSION: 1. No acute intra-abdominal process. 2. Small hiatal hernia and gastric surgery changes. 3. Hepatic steatosis. A linear tubular structure is noted in the left lobe of the liver, possible cyst versus focal biliary ductal dilatation. 4. Slightly complex lesion in the upper pole  of the right kidney. MRI is suggested for further evaluation. 5. Mild splenomegaly and varices. 6. Anasarca. 7. Aortic atherosclerosis. Electronically Signed   By: Leita Birmingham M.D.   On: 05/19/2024 16:46   Micro Results  Recent Results (from the past 240 hours)  Culture, blood (routine x 2)     Status: None (Preliminary result)   Collection Time: 05/28/24 11:13 AM   Specimen: BLOOD  Result Value Ref Range Status   Specimen Description BLOOD BLOOD RIGHT HAND  Final   Special Requests   Final    BOTTLES DRAWN AEROBIC AND ANAEROBIC Blood Culture adequate volume   Culture   Final    NO GROWTH 3 DAYS Performed at [facility], [address] [place], [place] [address]    Report Status PENDING  Incomplete  Culture, blood (routine x 2)     Status: None (Preliminary result)   Collection Time: 05/28/24 11:13 AM   Specimen: BLOOD  Result Value Ref Range Status   Specimen Description BLOOD BLOOD RIGHT FOREARM  Final   Special Requests   Final    BOTTLES DRAWN AEROBIC AND ANAEROBIC Blood Culture adequate volume   Culture   Final    NO GROWTH 3 DAYS Performed at [facility], [address] [place], [place] [address]    Report  Status PENDING  Incomplete    Today   Subjective    Lynn Andrade today has no new complaints - Eating and drinking well - Had mushy BM No fever  Or chills  -- No nausea or vomiting - No further confusional episodes - Tolerating around in room independently--- Eager to go home/requesting discharge home   Patient has been seen and examined prior to discharge   Objective   Blood pressure (!) 101/52, pulse 75, temperature 98 F (36.7 C), temperature source Oral, resp. rate 18, height 5' 5 (1.651 m), weight (!) 153.9 kg, SpO2 97%.   Intake/Output Summary (Last 24 hours) at 05/31/2024 1056 Last data filed at 05/30/2024 1500 Gross per 24 hour  Intake 444 ml  Output --  Net 444 ml    Exam Gen:- Awake Alert, no acute distress  HEENT:- [place].AT, mild sclera icterus Neck-Supple Neck,No JVD,.  Lungs-  CTAB , good air movement bilaterally CV- S1, S2 normal, regular Abd-  +ve B.Sounds, Abd Soft, No tenderness, increased truncal adiposity Extremity/Skin:- No  edema,   good pulses Psych-affect is appropriate, oriented x3 Neuro-no new focal deficits, no tremors   Data Review   CBC w Diff:  Lab Results  Component Value Date   WBC 2.9 (L) 05/31/2024   HGB 8.1 (L) 05/31/2024   HCT 25.2 (L) 05/31/2024   PLT 94 (L) 05/31/2024   LYMPHOPCT 6 05/28/2024   MONOPCT 9 05/28/2024   EOSPCT 0 05/28/2024   BASOPCT 0 05/28/2024   CMP:  Lab Results  Component Value Date   NA 139 05/31/2024   NA 145 (H) 01/13/2022   K 3.7 05/31/2024   CL 106 05/31/2024   CO2 25 05/31/2024   BUN 11 05/31/2024   BUN 14 01/13/2022   CREATININE 0.87 05/31/2024   PROT 5.5 (L) 05/31/2024   ALBUMIN  2.7 (L) 05/31/2024   BILITOT 1.6 (H) 05/31/2024   ALKPHOS 72 05/31/2024   AST 74 (H) 05/31/2024   ALT 32 05/31/2024   Total Discharge time is about 33 minutes  Rendall Carwin M.D on 05/31/2024 at 10:56 AM  Go to [email or web address] -  for contact info  [facility] - Office  [phone] ° ° °"

## 2024-05-31 NOTE — Plan of Care (Signed)
  Problem: Safety: Goal: Non-violent Restraint(s) Outcome: Progressing   Problem: Education: Goal: Knowledge of General Education information will improve Description: Including pain rating scale, medication(s)/side effects and non-pharmacologic comfort measures Outcome: Progressing   Problem: Health Behavior/Discharge Planning: Goal: Ability to manage health-related needs will improve Outcome: Progressing   Problem: Clinical Measurements: Goal: Ability to maintain clinical measurements within normal limits will improve Outcome: Progressing Goal: Will remain free from infection Outcome: Progressing Goal: Diagnostic test results will improve Outcome: Progressing Goal: Respiratory complications will improve Outcome: Progressing Goal: Cardiovascular complication will be avoided Outcome: Progressing   Problem: Activity: Goal: Risk for activity intolerance will decrease Outcome: Progressing   Problem: Nutrition: Goal: Adequate nutrition will be maintained Outcome: Progressing   Problem: Coping: Goal: Level of anxiety will decrease Outcome: Progressing   Problem: Elimination: Goal: Will not experience complications related to bowel motility Outcome: Progressing Goal: Will not experience complications related to urinary retention Outcome: Progressing   Problem: Pain Managment: Goal: General experience of comfort will improve and/or be controlled Outcome: Progressing   Problem: Safety: Goal: Ability to remain free from injury will improve Outcome: Progressing   Problem: Skin Integrity: Goal: Risk for impaired skin integrity will decrease Outcome: Progressing   Problem: Education: Goal: Ability to describe self-care measures that may prevent or decrease complications (Diabetes Survival Skills Education) will improve Outcome: Progressing Goal: Individualized Educational Video(s) Outcome: Progressing   Problem: Coping: Goal: Ability to adjust to condition or change  in health will improve Outcome: Progressing   Problem: Fluid Volume: Goal: Ability to maintain a balanced intake and output will improve Outcome: Progressing   Problem: Health Behavior/Discharge Planning: Goal: Ability to identify and utilize available resources and services will improve Outcome: Progressing Goal: Ability to manage health-related needs will improve Outcome: Progressing   Problem: Metabolic: Goal: Ability to maintain appropriate glucose levels will improve Outcome: Progressing   Problem: Nutritional: Goal: Maintenance of adequate nutrition will improve Outcome: Progressing Goal: Progress toward achieving an optimal weight will improve Outcome: Progressing   Problem: Skin Integrity: Goal: Risk for impaired skin integrity will decrease Outcome: Progressing   Problem: Tissue Perfusion: Goal: Adequacy of tissue perfusion will improve Outcome: Progressing

## 2024-06-02 LAB — CULTURE, BLOOD (ROUTINE X 2)
Culture: NO GROWTH
Culture: NO GROWTH
Special Requests: ADEQUATE
Special Requests: ADEQUATE

## 2024-07-10 ENCOUNTER — Ambulatory Visit (HOSPITAL_COMMUNITY)

## 2024-07-10 ENCOUNTER — Encounter: Admitting: Vascular Surgery
